# Patient Record
Sex: Female | Born: 1960 | Race: White | Hispanic: No | Smoking: Never smoker
Health system: Southern US, Community
[De-identification: ages and names within clinical notes are randomized; demographics above are authoritative.]

## PROBLEM LIST (undated history)

## (undated) DIAGNOSIS — I10 Essential (primary) hypertension: Secondary | ICD-10-CM

## (undated) DIAGNOSIS — E119 Type 2 diabetes mellitus without complications: Secondary | ICD-10-CM

## (undated) HISTORY — DX: Essential (primary) hypertension: I10

## (undated) HISTORY — PX: FRACTURE SURGERY: SHX138

## (undated) HISTORY — DX: Type 2 diabetes mellitus without complications: E11.9

---

## 1998-03-05 ENCOUNTER — Inpatient Hospital Stay (HOSPITAL_COMMUNITY): Admission: EM | Admit: 1998-03-05 | Discharge: 1998-03-06 | Payer: Self-pay | Admitting: Emergency Medicine

## 1998-03-05 ENCOUNTER — Encounter: Payer: Self-pay | Admitting: Emergency Medicine

## 1999-03-25 ENCOUNTER — Emergency Department (HOSPITAL_COMMUNITY): Admission: EM | Admit: 1999-03-25 | Discharge: 1999-03-25 | Payer: Self-pay | Admitting: *Deleted

## 2000-03-10 ENCOUNTER — Encounter: Payer: Self-pay | Admitting: Obstetrics and Gynecology

## 2000-03-10 ENCOUNTER — Ambulatory Visit (HOSPITAL_COMMUNITY): Admission: RE | Admit: 2000-03-10 | Discharge: 2000-03-10 | Payer: Self-pay | Admitting: Obstetrics and Gynecology

## 2000-03-29 ENCOUNTER — Other Ambulatory Visit: Admission: RE | Admit: 2000-03-29 | Discharge: 2000-03-29 | Payer: Self-pay | Admitting: Obstetrics and Gynecology

## 2000-05-17 ENCOUNTER — Encounter: Payer: Self-pay | Admitting: Obstetrics and Gynecology

## 2000-05-17 ENCOUNTER — Ambulatory Visit (HOSPITAL_COMMUNITY): Admission: RE | Admit: 2000-05-17 | Discharge: 2000-05-17 | Payer: Self-pay | Admitting: Obstetrics and Gynecology

## 2000-06-14 ENCOUNTER — Ambulatory Visit (HOSPITAL_COMMUNITY): Admission: RE | Admit: 2000-06-14 | Discharge: 2000-06-14 | Payer: Self-pay | Admitting: Obstetrics and Gynecology

## 2000-06-14 ENCOUNTER — Encounter: Payer: Self-pay | Admitting: Obstetrics and Gynecology

## 2000-06-17 ENCOUNTER — Ambulatory Visit (HOSPITAL_COMMUNITY): Admission: RE | Admit: 2000-06-17 | Discharge: 2000-06-17 | Payer: Self-pay | Admitting: Obstetrics and Gynecology

## 2000-07-12 ENCOUNTER — Inpatient Hospital Stay (HOSPITAL_COMMUNITY): Admission: AD | Admit: 2000-07-12 | Discharge: 2000-07-12 | Payer: Self-pay | Admitting: Obstetrics and Gynecology

## 2000-07-26 ENCOUNTER — Ambulatory Visit (HOSPITAL_COMMUNITY): Admission: RE | Admit: 2000-07-26 | Discharge: 2000-07-26 | Payer: Self-pay | Admitting: Obstetrics and Gynecology

## 2000-08-02 ENCOUNTER — Inpatient Hospital Stay (HOSPITAL_COMMUNITY): Admission: AD | Admit: 2000-08-02 | Discharge: 2000-08-02 | Payer: Self-pay | Admitting: Obstetrics and Gynecology

## 2000-08-02 ENCOUNTER — Encounter: Payer: Self-pay | Admitting: Obstetrics and Gynecology

## 2000-08-06 ENCOUNTER — Inpatient Hospital Stay (HOSPITAL_COMMUNITY): Admission: AD | Admit: 2000-08-06 | Discharge: 2000-08-11 | Payer: Self-pay | Admitting: Obstetrics and Gynecology

## 2002-05-07 ENCOUNTER — Emergency Department (HOSPITAL_COMMUNITY): Admission: EM | Admit: 2002-05-07 | Discharge: 2002-05-07 | Payer: Self-pay | Admitting: Emergency Medicine

## 2003-07-17 ENCOUNTER — Ambulatory Visit (HOSPITAL_COMMUNITY): Admission: RE | Admit: 2003-07-17 | Discharge: 2003-07-17 | Payer: Self-pay | Admitting: Emergency Medicine

## 2003-07-23 ENCOUNTER — Encounter: Admission: RE | Admit: 2003-07-23 | Discharge: 2003-07-23 | Payer: Self-pay | Admitting: Emergency Medicine

## 2004-08-27 ENCOUNTER — Ambulatory Visit: Payer: Self-pay | Admitting: Internal Medicine

## 2004-08-31 ENCOUNTER — Ambulatory Visit: Payer: Self-pay | Admitting: *Deleted

## 2004-09-15 ENCOUNTER — Ambulatory Visit: Payer: Self-pay | Admitting: Internal Medicine

## 2005-01-19 ENCOUNTER — Ambulatory Visit: Payer: Self-pay | Admitting: Internal Medicine

## 2005-04-12 ENCOUNTER — Ambulatory Visit: Payer: Self-pay | Admitting: Internal Medicine

## 2005-06-07 HISTORY — PX: BACK SURGERY: SHX140

## 2005-08-20 ENCOUNTER — Ambulatory Visit: Payer: Self-pay | Admitting: Internal Medicine

## 2005-08-31 ENCOUNTER — Ambulatory Visit: Payer: Self-pay | Admitting: Internal Medicine

## 2005-09-01 ENCOUNTER — Ambulatory Visit (HOSPITAL_COMMUNITY): Admission: RE | Admit: 2005-09-01 | Discharge: 2005-09-01 | Payer: Self-pay | Admitting: Internal Medicine

## 2005-09-03 ENCOUNTER — Ambulatory Visit: Payer: Self-pay | Admitting: Internal Medicine

## 2005-09-17 ENCOUNTER — Ambulatory Visit: Payer: Self-pay | Admitting: Internal Medicine

## 2005-10-06 ENCOUNTER — Ambulatory Visit (HOSPITAL_COMMUNITY): Admission: RE | Admit: 2005-10-06 | Discharge: 2005-10-07 | Payer: Self-pay | Admitting: Neurosurgery

## 2005-10-15 ENCOUNTER — Ambulatory Visit: Payer: Self-pay | Admitting: Internal Medicine

## 2010-10-20 ENCOUNTER — Ambulatory Visit: Payer: Self-pay

## 2010-10-26 ENCOUNTER — Ambulatory Visit: Payer: Self-pay | Attending: Family Medicine

## 2010-10-26 DIAGNOSIS — R5381 Other malaise: Secondary | ICD-10-CM | POA: Insufficient documentation

## 2010-10-26 DIAGNOSIS — M25519 Pain in unspecified shoulder: Secondary | ICD-10-CM | POA: Insufficient documentation

## 2010-10-26 DIAGNOSIS — IMO0001 Reserved for inherently not codable concepts without codable children: Secondary | ICD-10-CM | POA: Insufficient documentation

## 2010-10-26 DIAGNOSIS — M2560 Stiffness of unspecified joint, not elsewhere classified: Secondary | ICD-10-CM | POA: Insufficient documentation

## 2010-11-03 ENCOUNTER — Ambulatory Visit: Payer: Self-pay

## 2010-11-09 ENCOUNTER — Encounter: Payer: Self-pay | Admitting: Physical Therapy

## 2010-11-20 ENCOUNTER — Encounter: Payer: Self-pay | Admitting: Physical Therapy

## 2010-11-23 ENCOUNTER — Encounter: Payer: Self-pay | Admitting: Physical Therapy

## 2010-11-24 ENCOUNTER — Encounter: Payer: Self-pay | Admitting: Physical Therapy

## 2011-07-13 ENCOUNTER — Ambulatory Visit
Admission: RE | Admit: 2011-07-13 | Discharge: 2011-07-13 | Disposition: A | Discharge status: Home / Self Care | Payer: Managed Care, Other (non HMO) | Source: Ambulatory Visit | Attending: Family Medicine | Admitting: Family Medicine

## 2011-07-13 ENCOUNTER — Other Ambulatory Visit: Payer: Self-pay | Admitting: Family Medicine

## 2011-07-13 DIAGNOSIS — J209 Acute bronchitis, unspecified: Secondary | ICD-10-CM

## 2013-11-05 ENCOUNTER — Ambulatory Visit: Payer: Managed Care, Other (non HMO) | Admitting: Emergency Medicine

## 2013-11-05 VITALS — BP 168/98 | HR 83 | Temp 98.8°F | Resp 18 | Ht 66.0 in | Wt 315.4 lb

## 2013-11-05 DIAGNOSIS — E782 Mixed hyperlipidemia: Secondary | ICD-10-CM

## 2013-11-05 DIAGNOSIS — G4733 Obstructive sleep apnea (adult) (pediatric): Secondary | ICD-10-CM

## 2013-11-05 DIAGNOSIS — I10 Essential (primary) hypertension: Secondary | ICD-10-CM

## 2013-11-05 DIAGNOSIS — E119 Type 2 diabetes mellitus without complications: Secondary | ICD-10-CM

## 2013-11-05 MED ORDER — SITAGLIPTIN PHOS-METFORMIN HCL 50-1000 MG PO TABS: 1.0000 | ORAL_TABLET | ORAL | Status: DC

## 2013-11-05 MED ORDER — LISINOPRIL-HYDROCHLOROTHIAZIDE 20-25 MG PO TABS: 1.0000 | ORAL_TABLET | Freq: Every day | ORAL | Status: DC

## 2013-11-05 NOTE — Patient Instructions (Signed)

## 2013-11-05 NOTE — Progress Notes (Signed)
[facility] [address] [place] [address] [phone]  Date:  11/05/2013   Name:  Diane Lawson   DOB:  10/01/1960   MRN:  [id number]  PCP:  Emeterio Reeve, MD    Chief Complaint: Medication Refill   History of Present Illness:  Diane Lawson is a 53 y.o. very pleasant female patient who presents with the following:  NIDDM and hypertension.  Out of medications for a week.  Can't get in to see her regular doctor.  Tolerating her medications with no adverse effects.  Denies other complaint or health concern today.   There are no active problems to display for this patient.   Past Medical History  Diagnosis Date  . Diabetes mellitus without complication   . Hypertension     Past Surgical History  Procedure Laterality Date  . Cesarean section    . Fracture surgery      ankle (1999)  . Back surgery  2007    History  Substance Use Topics  . Smoking status: Never Smoker   . Smokeless tobacco: Not on file  . Alcohol Use: No    Family History  Problem Relation Age of Onset  . Adopted: Yes    No Known Allergies  Medication list has been reviewed and updated.  No current outpatient prescriptions on file prior to visit.   No current facility-administered medications on file prior to visit.    Review of Systems:  As per HPI, otherwise negative.    Physical Examination: Filed Vitals:   11/05/13 0954  BP: 168/98  Pulse: 83  Temp: 98.8 F (37.1 C)  Resp: 18   Filed Vitals:   11/05/13 0954  Height: 5\' 6"  (1.676 m)  Weight: 315 lb 6.4 oz (143.065 kg)   Body mass index is 50.93 kg/(m^2). Ideal Body Weight: Weight in (lb) to have BMI = 25: 154.6  GEN: morbid obesity, NAD, Non-toxic, A & O x 3 HEENT: Atraumatic, Normocephalic. Neck supple. No masses, No LAD. Ears and Nose: No external deformity. CV: RRR, No M/G/R. No JVD. No thrill. No extra heart sounds. PULM: CTA B, no wheezes, crackles, rhonchi. No retractions.  No resp. distress. No accessory muscle use. ABD: S, NT, ND, +BS. No rebound. No HSM. EXTR: No c/c/e NEURO Normal gait.  PSYCH: Normally interactive. Conversant. Not depressed or anxious appearing.  Calm demeanor.    Assessment and Plan: NIDDM Hypertension HLD Refills for one month Follow up with FMD   Signed,  Phillips Odor, MD

## 2014-06-20 ENCOUNTER — Other Ambulatory Visit (HOSPITAL_COMMUNITY)
Admission: RE | Admit: 2014-06-20 | Discharge: 2014-06-20 | Disposition: A | Discharge status: Home / Self Care | Payer: Managed Care, Other (non HMO) | Source: Ambulatory Visit | Attending: Family Medicine | Admitting: Family Medicine

## 2014-06-20 DIAGNOSIS — Z124 Encounter for screening for malignant neoplasm of cervix: Secondary | ICD-10-CM | POA: Diagnosis not present

## 2014-06-20 DIAGNOSIS — Z1151 Encounter for screening for human papillomavirus (HPV): Secondary | ICD-10-CM | POA: Insufficient documentation

## 2014-06-21 ENCOUNTER — Other Ambulatory Visit: Payer: Self-pay | Admitting: Family Medicine

## 2014-06-26 LAB — CYTOLOGY - PAP

## 2014-12-11 ENCOUNTER — Ambulatory Visit: Payer: Managed Care, Other (non HMO) | Admitting: Endocrinology

## 2020-11-06 ENCOUNTER — Emergency Department (HOSPITAL_COMMUNITY): Payer: Managed Care, Other (non HMO)

## 2020-11-06 ENCOUNTER — Encounter (HOSPITAL_COMMUNITY): Admission: EM | Disposition: A | Discharge status: Home / Self Care | Payer: Self-pay | Source: Home / Self Care

## 2020-11-06 ENCOUNTER — Inpatient Hospital Stay (HOSPITAL_COMMUNITY): Payer: Managed Care, Other (non HMO) | Admitting: Certified Registered Nurse Anesthetist

## 2020-11-06 ENCOUNTER — Encounter (HOSPITAL_COMMUNITY): Payer: Self-pay

## 2020-11-06 ENCOUNTER — Other Ambulatory Visit: Payer: Self-pay

## 2020-11-06 ENCOUNTER — Inpatient Hospital Stay (HOSPITAL_COMMUNITY)
Admission: EM | Admit: 2020-11-06 | Discharge: 2020-11-13 | DRG: 339 | Disposition: A | Discharge status: Home / Self Care | Payer: Managed Care, Other (non HMO) | Attending: General Surgery | Admitting: General Surgery

## 2020-11-06 DIAGNOSIS — Z6841 Body Mass Index (BMI) 40.0 and over, adult: Secondary | ICD-10-CM | POA: Diagnosis not present

## 2020-11-06 DIAGNOSIS — I10 Essential (primary) hypertension: Secondary | ICD-10-CM | POA: Diagnosis present

## 2020-11-06 DIAGNOSIS — E119 Type 2 diabetes mellitus without complications: Secondary | ICD-10-CM | POA: Diagnosis present

## 2020-11-06 DIAGNOSIS — R188 Other ascites: Secondary | ICD-10-CM

## 2020-11-06 DIAGNOSIS — K567 Ileus, unspecified: Secondary | ICD-10-CM | POA: Diagnosis not present

## 2020-11-06 DIAGNOSIS — Z794 Long term (current) use of insulin: Secondary | ICD-10-CM | POA: Diagnosis not present

## 2020-11-06 DIAGNOSIS — Z9049 Acquired absence of other specified parts of digestive tract: Secondary | ICD-10-CM

## 2020-11-06 DIAGNOSIS — Z880 Allergy status to penicillin: Secondary | ICD-10-CM | POA: Diagnosis not present

## 2020-11-06 DIAGNOSIS — Z79899 Other long term (current) drug therapy: Secondary | ICD-10-CM | POA: Diagnosis not present

## 2020-11-06 DIAGNOSIS — K3533 Acute appendicitis with perforation and localized peritonitis, with abscess: Principal | ICD-10-CM | POA: Diagnosis present

## 2020-11-06 DIAGNOSIS — K3532 Acute appendicitis with perforation and localized peritonitis, without abscess: Secondary | ICD-10-CM | POA: Diagnosis present

## 2020-11-06 DIAGNOSIS — K9189 Other postprocedural complications and disorders of digestive system: Secondary | ICD-10-CM

## 2020-11-06 DIAGNOSIS — Z20822 Contact with and (suspected) exposure to covid-19: Secondary | ICD-10-CM | POA: Diagnosis present

## 2020-11-06 HISTORY — PX: LAPAROSCOPIC APPENDECTOMY: SHX408

## 2020-11-06 LAB — COMPREHENSIVE METABOLIC PANEL
ALT: 23 U/L (ref 0–44)
AST: 19 U/L (ref 15–41)
Albumin: 3.3 g/dL — ABNORMAL LOW (ref 3.5–5.0)
Alkaline Phosphatase: 94 U/L (ref 38–126)
Anion gap: 13 (ref 5–15)
BUN: 17 mg/dL (ref 6–20)
CO2: 22 mmol/L (ref 22–32)
Calcium: 9.2 mg/dL (ref 8.9–10.3)
Chloride: 100 mmol/L (ref 98–111)
Creatinine, Ser: 1.12 mg/dL — ABNORMAL HIGH (ref 0.44–1.00)
GFR, Estimated: 57 mL/min — ABNORMAL LOW (ref >60)
Glucose, Bld: 298 mg/dL — ABNORMAL HIGH (ref 70–99)
Potassium: 4.1 mmol/L (ref 3.5–5.1)
Sodium: 135 mmol/L (ref 135–145)
Total Bilirubin: 2.1 mg/dL — ABNORMAL HIGH (ref 0.3–1.2)
Total Protein: 6.7 g/dL (ref 6.5–8.1)

## 2020-11-06 LAB — RESP PANEL BY RT-PCR (FLU A&B, COVID) ARPGX2
Influenza A by PCR: NEGATIVE
Influenza B by PCR: NEGATIVE
SARS Coronavirus 2 by RT PCR: NEGATIVE

## 2020-11-06 LAB — URINALYSIS, ROUTINE W REFLEX MICROSCOPIC
Bacteria, UA: NONE SEEN
Bilirubin Urine: NEGATIVE
Glucose, UA: >=500 mg/dL — AB
Hgb urine dipstick: NEGATIVE
Ketones, ur: 20 mg/dL — AB
Leukocytes,Ua: NEGATIVE
Nitrite: NEGATIVE
Protein, ur: 30 mg/dL — AB
Specific Gravity, Urine: 1.028 (ref 1.005–1.030)
pH: 5 (ref 5.0–8.0)

## 2020-11-06 LAB — CBC WITH DIFFERENTIAL/PLATELET
Abs Immature Granulocytes: 0.03 10*3/uL (ref 0.00–0.07)
Basophils Absolute: 0 10*3/uL (ref 0.0–0.1)
Basophils Relative: 0 %
Eosinophils Absolute: 0 10*3/uL (ref 0.0–0.5)
Eosinophils Relative: 0 %
HCT: 56.1 % — ABNORMAL HIGH (ref 36.0–46.0)
Hemoglobin: 18.4 g/dL — ABNORMAL HIGH (ref 12.0–15.0)
Immature Granulocytes: 1 %
Lymphocytes Relative: 6 %
Lymphs Abs: 0.4 10*3/uL — ABNORMAL LOW (ref 0.7–4.0)
MCH: 28.9 pg (ref 26.0–34.0)
MCHC: 32.8 g/dL (ref 30.0–36.0)
MCV: 88.2 fL (ref 80.0–100.0)
Monocytes Absolute: 0.3 10*3/uL (ref 0.1–1.0)
Monocytes Relative: 4 %
Neutro Abs: 5.7 10*3/uL (ref 1.7–7.7)
Neutrophils Relative %: 89 %
Platelets: 99 10*3/uL — ABNORMAL LOW (ref 150–400)
RBC: 6.36 MIL/uL — ABNORMAL HIGH (ref 3.87–5.11)
RDW: 15.2 % (ref 11.5–15.5)
WBC: 6.4 10*3/uL (ref 4.0–10.5)
nRBC: 0 % (ref 0.0–0.2)

## 2020-11-06 LAB — GLUCOSE, CAPILLARY
Glucose-Capillary: 353 mg/dL — ABNORMAL HIGH (ref 70–99)
Glucose-Capillary: 302 mg/dL — ABNORMAL HIGH (ref 70–99)
Glucose-Capillary: 268 mg/dL — ABNORMAL HIGH (ref 70–99)
Glucose-Capillary: 402 mg/dL — ABNORMAL HIGH (ref 70–99)

## 2020-11-06 LAB — LACTIC ACID, PLASMA
Lactic Acid, Venous: 2 mmol/L (ref 0.5–1.9)
Lactic Acid, Venous: 2.1 mmol/L (ref 0.5–1.9)

## 2020-11-06 LAB — LIPASE, BLOOD: Lipase: 24 U/L (ref 11–51)

## 2020-11-06 SURGERY — APPENDECTOMY, LAPAROSCOPIC
Anesthesia: General

## 2020-11-06 MED ORDER — TRAMADOL HCL 50 MG PO TABS
50.0000 mg | ORAL_TABLET | Freq: Four times a day (QID) | ORAL | Status: DC | PRN
Start: 2020-11-06 — End: 2020-11-12
  Administered 2020-11-06 – 2020-11-08 (×5): 50 mg via ORAL
  Filled 2020-11-06 (×5): qty 1

## 2020-11-06 MED ORDER — INSULIN ASPART 100 UNIT/ML IJ SOLN
INTRAMUSCULAR | Status: AC
  Filled 2020-11-06: qty 1

## 2020-11-06 MED ORDER — OXYCODONE HCL 5 MG PO TABS
5.0000 mg | ORAL_TABLET | ORAL | Status: DC | PRN
  Administered 2020-11-06 – 2020-11-07 (×2): 5 mg via ORAL
  Filled 2020-11-06 (×4): qty 1
  Filled 2020-11-06: qty 2

## 2020-11-06 MED ORDER — METRONIDAZOLE 500 MG/100ML IV SOLN
500.0000 mg | Freq: Once | INTRAVENOUS | Status: AC
  Administered 2020-11-06: 500 mg via INTRAVENOUS
  Filled 2020-11-06: qty 100

## 2020-11-06 MED ORDER — INSULIN ASPART 100 UNIT/ML IJ SOLN
0.0000 [IU] | Freq: Three times a day (TID) | INTRAMUSCULAR | Status: DC
  Administered 2020-11-06: 8 [IU] via SUBCUTANEOUS
  Administered 2020-11-06: 15 [IU] via SUBCUTANEOUS
  Administered 2020-11-07: 8 [IU] via SUBCUTANEOUS

## 2020-11-06 MED ORDER — SODIUM CHLORIDE 0.9 % IV SOLN
2.0000 g | Freq: Once | INTRAVENOUS | Status: AC
  Administered 2020-11-06: 2 g via INTRAVENOUS
  Filled 2020-11-06: qty 2

## 2020-11-06 MED ORDER — ENOXAPARIN SODIUM 40 MG/0.4ML IJ SOSY: 40.0000 mg | PREFILLED_SYRINGE | INTRAMUSCULAR | Status: DC

## 2020-11-06 MED ORDER — PROPOFOL 10 MG/ML IV BOLUS
INTRAVENOUS | Status: DC | PRN
  Administered 2020-11-06: 200 mg via INTRAVENOUS

## 2020-11-06 MED ORDER — ROCURONIUM BROMIDE 10 MG/ML (PF) SYRINGE
PREFILLED_SYRINGE | INTRAVENOUS | Status: DC | PRN
  Administered 2020-11-06: 30 mg via INTRAVENOUS
  Administered 2020-11-06: 20 mg via INTRAVENOUS
  Administered 2020-11-06: 70 mg via INTRAVENOUS
  Administered 2020-11-06: 10 mg via INTRAVENOUS

## 2020-11-06 MED ORDER — ACETAMINOPHEN 10 MG/ML IV SOLN
INTRAVENOUS | Status: AC
  Filled 2020-11-06: qty 100

## 2020-11-06 MED ORDER — OXYCODONE HCL 5 MG PO TABS: 5.0000 mg | ORAL_TABLET | ORAL | Status: DC | PRN

## 2020-11-06 MED ORDER — FENTANYL CITRATE (PF) 250 MCG/5ML IJ SOLN
INTRAMUSCULAR | Status: AC
  Filled 2020-11-06: qty 5

## 2020-11-06 MED ORDER — LIDOCAINE 2% (20 MG/ML) 5 ML SYRINGE
INTRAMUSCULAR | Status: DC | PRN
  Administered 2020-11-06: 100 mg via INTRAVENOUS

## 2020-11-06 MED ORDER — SODIUM CHLORIDE 0.45 % IV SOLN
INTRAVENOUS | Status: DC
  Administered 2020-11-07: 999 mL via INTRAVENOUS

## 2020-11-06 MED ORDER — MELATONIN 3 MG PO TABS
3.0000 mg | ORAL_TABLET | Freq: Every evening | ORAL | Status: DC | PRN
  Administered 2020-11-06 – 2020-11-12 (×4): 3 mg via ORAL
  Filled 2020-11-06 (×5): qty 1

## 2020-11-06 MED ORDER — BUPIVACAINE-EPINEPHRINE 0.25% -1:200000 IJ SOLN
INTRAMUSCULAR | Status: DC | PRN
  Administered 2020-11-06: 20 mL

## 2020-11-06 MED ORDER — FENTANYL CITRATE (PF) 250 MCG/5ML IJ SOLN
INTRAMUSCULAR | Status: DC | PRN
  Administered 2020-11-06 (×2): 50 ug via INTRAVENOUS

## 2020-11-06 MED ORDER — BUPROPION HCL ER (XL) 150 MG PO TB24
300.0000 mg | ORAL_TABLET | Freq: Every day | ORAL | Status: DC
  Administered 2020-11-07 – 2020-11-13 (×6): 300 mg via ORAL
  Filled 2020-11-06 (×3): qty 2
  Filled 2020-11-06: qty 1
  Filled 2020-11-06 (×3): qty 2

## 2020-11-06 MED ORDER — AMISULPRIDE (ANTIEMETIC) 5 MG/2ML IV SOLN: 10.0000 mg | Freq: Once | INTRAVENOUS | Status: DC | PRN

## 2020-11-06 MED ORDER — MIDAZOLAM HCL 2 MG/2ML IJ SOLN
INTRAMUSCULAR | Status: DC | PRN
  Administered 2020-11-06: 2 mg via INTRAVENOUS

## 2020-11-06 MED ORDER — PHENYLEPHRINE 40 MCG/ML (10ML) SYRINGE FOR IV PUSH (FOR BLOOD PRESSURE SUPPORT)
PREFILLED_SYRINGE | INTRAVENOUS | Status: DC | PRN
  Administered 2020-11-06: 120 ug via INTRAVENOUS

## 2020-11-06 MED ORDER — LACTATED RINGERS IV SOLN: INTRAVENOUS | Status: DC

## 2020-11-06 MED ORDER — ACETAMINOPHEN 500 MG PO TABS
1000.0000 mg | ORAL_TABLET | Freq: Four times a day (QID) | ORAL | Status: DC
  Administered 2020-11-06 – 2020-11-12 (×19): 1000 mg via ORAL
  Filled 2020-11-06 (×20): qty 2

## 2020-11-06 MED ORDER — SIMETHICONE 80 MG PO CHEW: 40.0000 mg | CHEWABLE_TABLET | Freq: Four times a day (QID) | ORAL | Status: DC | PRN

## 2020-11-06 MED ORDER — METHOCARBAMOL 500 MG PO TABS
500.0000 mg | ORAL_TABLET | Freq: Four times a day (QID) | ORAL | Status: DC | PRN
  Administered 2020-11-06 – 2020-11-09 (×2): 500 mg via ORAL
  Filled 2020-11-06 (×3): qty 1

## 2020-11-06 MED ORDER — HYDROCHLOROTHIAZIDE 25 MG PO TABS
12.5000 mg | ORAL_TABLET | Freq: Every morning | ORAL | Status: DC
  Administered 2020-11-07 – 2020-11-13 (×6): 12.5 mg via ORAL
  Filled 2020-11-06 (×6): qty 1

## 2020-11-06 MED ORDER — DEXAMETHASONE SODIUM PHOSPHATE 10 MG/ML IJ SOLN
INTRAMUSCULAR | Status: DC | PRN
  Administered 2020-11-06: 4 mg via INTRAVENOUS

## 2020-11-06 MED ORDER — SODIUM CHLORIDE 0.9 % IV SOLN: INTRAVENOUS | Status: DC

## 2020-11-06 MED ORDER — FENTANYL CITRATE (PF) 100 MCG/2ML IJ SOLN: 25.0000 ug | INTRAMUSCULAR | Status: DC | PRN

## 2020-11-06 MED ORDER — MIDAZOLAM HCL 2 MG/2ML IJ SOLN
INTRAMUSCULAR | Status: AC
  Filled 2020-11-06: qty 2

## 2020-11-06 MED ORDER — SODIUM CHLORIDE 0.9 % IV BOLUS
1000.0000 mL | Freq: Once | INTRAVENOUS | Status: AC
  Administered 2020-11-06: 1000 mL via INTRAVENOUS

## 2020-11-06 MED ORDER — ONDANSETRON 4 MG PO TBDP: 4.0000 mg | ORAL_TABLET | Freq: Four times a day (QID) | ORAL | Status: DC | PRN

## 2020-11-06 MED ORDER — FENTANYL CITRATE (PF) 100 MCG/2ML IJ SOLN
50.0000 ug | Freq: Once | INTRAMUSCULAR | Status: AC
  Administered 2020-11-06: 50 ug via INTRAVENOUS
  Filled 2020-11-06: qty 2

## 2020-11-06 MED ORDER — HYDRALAZINE HCL 20 MG/ML IJ SOLN: 10.0000 mg | INTRAMUSCULAR | Status: DC | PRN

## 2020-11-06 MED ORDER — PROPOFOL 10 MG/ML IV BOLUS
INTRAVENOUS | Status: AC
  Filled 2020-11-06: qty 40

## 2020-11-06 MED ORDER — DEXAMETHASONE SODIUM PHOSPHATE 10 MG/ML IJ SOLN
INTRAMUSCULAR | Status: AC
  Filled 2020-11-06: qty 1

## 2020-11-06 MED ORDER — CHLORHEXIDINE GLUCONATE 0.12 % MT SOLN
OROMUCOSAL | Status: AC
  Administered 2020-11-06: 15 mL via OROMUCOSAL
  Filled 2020-11-06: qty 15

## 2020-11-06 MED ORDER — METRONIDAZOLE 500 MG/100ML IV SOLN
500.0000 mg | Freq: Once | INTRAVENOUS | Status: DC
  Filled 2020-11-06 (×2): qty 100

## 2020-11-06 MED ORDER — OXYCODONE HCL 5 MG PO TABS: 5.0000 mg | ORAL_TABLET | Freq: Once | ORAL | Status: DC | PRN

## 2020-11-06 MED ORDER — SUCCINYLCHOLINE CHLORIDE 200 MG/10ML IV SOSY
PREFILLED_SYRINGE | INTRAVENOUS | Status: AC
  Filled 2020-11-06: qty 10

## 2020-11-06 MED ORDER — ONDANSETRON HCL 4 MG/2ML IJ SOLN
4.0000 mg | Freq: Once | INTRAMUSCULAR | Status: AC
  Administered 2020-11-06: 4 mg via INTRAVENOUS
  Filled 2020-11-06: qty 2

## 2020-11-06 MED ORDER — ONDANSETRON HCL 4 MG/2ML IJ SOLN
4.0000 mg | Freq: Four times a day (QID) | INTRAMUSCULAR | Status: DC | PRN
  Administered 2020-11-08 – 2020-11-11 (×8): 4 mg via INTRAVENOUS
  Filled 2020-11-06 (×8): qty 2

## 2020-11-06 MED ORDER — CHLORHEXIDINE GLUCONATE 0.12 % MT SOLN
15.0000 mL | OROMUCOSAL | Status: AC
  Filled 2020-11-06: qty 15

## 2020-11-06 MED ORDER — ONDANSETRON HCL 4 MG/2ML IJ SOLN
INTRAMUSCULAR | Status: AC
  Filled 2020-11-06: qty 2

## 2020-11-06 MED ORDER — SODIUM CHLORIDE 0.9 % IV SOLN
2.0000 g | Freq: Three times a day (TID) | INTRAVENOUS | Status: DC
  Administered 2020-11-06 – 2020-11-13 (×21): 2 g via INTRAVENOUS
  Filled 2020-11-06 (×21): qty 2

## 2020-11-06 MED ORDER — ONDANSETRON HCL 4 MG/2ML IJ SOLN
INTRAMUSCULAR | Status: DC | PRN
  Administered 2020-11-06: 4 mg via INTRAVENOUS

## 2020-11-06 MED ORDER — SUCCINYLCHOLINE CHLORIDE 200 MG/10ML IV SOSY
PREFILLED_SYRINGE | INTRAVENOUS | Status: DC | PRN
  Administered 2020-11-06: 160 mg via INTRAVENOUS

## 2020-11-06 MED ORDER — DIPHENHYDRAMINE HCL 25 MG PO CAPS: 25.0000 mg | ORAL_CAPSULE | Freq: Four times a day (QID) | ORAL | Status: DC | PRN

## 2020-11-06 MED ORDER — SUGAMMADEX SODIUM 500 MG/5ML IV SOLN
INTRAVENOUS | Status: DC | PRN
  Administered 2020-11-06: 400 mg via INTRAVENOUS

## 2020-11-06 MED ORDER — ONDANSETRON HCL 4 MG/2ML IJ SOLN: 4.0000 mg | Freq: Four times a day (QID) | INTRAMUSCULAR | Status: DC | PRN

## 2020-11-06 MED ORDER — ACETAMINOPHEN 650 MG RE SUPP: 650.0000 mg | Freq: Four times a day (QID) | RECTAL | Status: DC | PRN

## 2020-11-06 MED ORDER — ONDANSETRON HCL 4 MG/2ML IJ SOLN: 4.0000 mg | Freq: Once | INTRAMUSCULAR | Status: DC | PRN

## 2020-11-06 MED ORDER — SUGAMMADEX SODIUM 500 MG/5ML IV SOLN
INTRAVENOUS | Status: AC
  Filled 2020-11-06: qty 5

## 2020-11-06 MED ORDER — METRONIDAZOLE 500 MG/100ML IV SOLN
500.0000 mg | Freq: Three times a day (TID) | INTRAVENOUS | Status: DC
  Administered 2020-11-06 – 2020-11-13 (×21): 500 mg via INTRAVENOUS
  Filled 2020-11-06 (×20): qty 100

## 2020-11-06 MED ORDER — INSULIN GLARGINE 100 UNIT/ML ~~LOC~~ SOLN
30.0000 [IU] | Freq: Every day | SUBCUTANEOUS | Status: DC
  Administered 2020-11-06 – 2020-11-07 (×2): 30 [IU] via SUBCUTANEOUS
  Filled 2020-11-06 (×3): qty 0.3

## 2020-11-06 MED ORDER — MORPHINE SULFATE (PF) 2 MG/ML IV SOLN: 1.0000 mg | INTRAVENOUS | Status: DC | PRN

## 2020-11-06 MED ORDER — DIPHENHYDRAMINE HCL 50 MG/ML IJ SOLN: 25.0000 mg | Freq: Four times a day (QID) | INTRAMUSCULAR | Status: DC | PRN

## 2020-11-06 MED ORDER — ENOXAPARIN SODIUM 40 MG/0.4ML IJ SOSY
40.0000 mg | PREFILLED_SYRINGE | INTRAMUSCULAR | Status: DC
  Administered 2020-11-07 – 2020-11-11 (×5): 40 mg via SUBCUTANEOUS
  Filled 2020-11-06 (×5): qty 0.4

## 2020-11-06 MED ORDER — OXYCODONE HCL 5 MG/5ML PO SOLN: 5.0000 mg | Freq: Once | ORAL | Status: DC | PRN

## 2020-11-06 MED ORDER — ACETAMINOPHEN 10 MG/ML IV SOLN
INTRAVENOUS | Status: DC | PRN
  Administered 2020-11-06: 1000 mg via INTRAVENOUS

## 2020-11-06 MED ORDER — HYDROMORPHONE HCL 1 MG/ML IJ SOLN: 1.0000 mg | INTRAMUSCULAR | Status: DC | PRN

## 2020-11-06 MED ORDER — SODIUM CHLORIDE 0.9 % IR SOLN
Status: DC | PRN
  Administered 2020-11-06: 1000 mL

## 2020-11-06 MED ORDER — ROCURONIUM BROMIDE 10 MG/ML (PF) SYRINGE
PREFILLED_SYRINGE | INTRAVENOUS | Status: AC
  Filled 2020-11-06: qty 10

## 2020-11-06 MED ORDER — LIDOCAINE 2% (20 MG/ML) 5 ML SYRINGE
INTRAMUSCULAR | Status: AC
  Filled 2020-11-06: qty 5

## 2020-11-06 MED ORDER — ACETAMINOPHEN 325 MG PO TABS: 650.0000 mg | ORAL_TABLET | Freq: Four times a day (QID) | ORAL | Status: DC | PRN

## 2020-11-06 SURGICAL SUPPLY — 42 items
APPLIER CLIP ROT 10 11.4 M/L (STAPLE)
BIOPATCH RED 1 DISK 7.0 (GAUZE/BANDAGES/DRESSINGS) ×2 IMPLANT
BIOPATCH RED 1IN DISK 7.0MM (GAUZE/BANDAGES/DRESSINGS) ×1
CANISTER SUCT 3000ML PPV (MISCELLANEOUS) ×3 IMPLANT
CHLORAPREP W/TINT 26 (MISCELLANEOUS) ×6 IMPLANT
CLIP APPLIE ROT 10 11.4 M/L (STAPLE) IMPLANT
COVER SURGICAL LIGHT HANDLE (MISCELLANEOUS) ×6 IMPLANT
CUTTER FLEX LINEAR 45M (STAPLE) ×3 IMPLANT
DERMABOND ADHESIVE PROPEN (GAUZE/BANDAGES/DRESSINGS) ×2
DERMABOND ADVANCED .7 DNX6 (GAUZE/BANDAGES/DRESSINGS) ×1 IMPLANT
DRAIN CHANNEL 19F RND (DRAIN) ×3 IMPLANT
ELECT REM PT RETURN 15FT ADLT (MISCELLANEOUS) ×3 IMPLANT
ELECT REM PT RETURN 9FT ADLT (ELECTROSURGICAL) ×3
ELECTRODE REM PT RTRN 9FT ADLT (ELECTROSURGICAL) ×1 IMPLANT
EVACUATOR SILICONE 100CC (DRAIN) ×3 IMPLANT
GAUZE SPONGE 2X2 8PLY STRL LF (GAUZE/BANDAGES/DRESSINGS) ×1 IMPLANT
GLOVE SURG ORTHO 8.0 STRL STRW (GLOVE) ×3 IMPLANT
GOWN STRL REUS W/ TWL LRG LVL3 (GOWN DISPOSABLE) ×2 IMPLANT
GOWN STRL REUS W/ TWL XL LVL3 (GOWN DISPOSABLE) ×1 IMPLANT
GOWN STRL REUS W/TWL LRG LVL3 (GOWN DISPOSABLE) ×6
GOWN STRL REUS W/TWL XL LVL3 (GOWN DISPOSABLE) ×3
KIT BASIN OR (CUSTOM PROCEDURE TRAY) ×3 IMPLANT
KIT TURNOVER KIT B (KITS) ×3 IMPLANT
PAD ARMBOARD 7.5X6 YLW CONV (MISCELLANEOUS) ×6 IMPLANT
POUCH SPECIMEN RETRIEVAL 10MM (ENDOMECHANICALS) ×3 IMPLANT
RELOAD 45 THICK GREEN (ENDOMECHANICALS) ×3 IMPLANT
RELOAD STAPLE TA45 3.5 REG BLU (ENDOMECHANICALS) ×3 IMPLANT
SET IRRIG TUBING LAPAROSCOPIC (IRRIGATION / IRRIGATOR) ×3 IMPLANT
SET TUBE SMOKE EVAC HIGH FLOW (TUBING) ×3 IMPLANT
SHEARS HARMONIC ACE PLUS 36CM (ENDOMECHANICALS) ×3 IMPLANT
SPECIMEN JAR SMALL (MISCELLANEOUS) ×3 IMPLANT
SPONGE GAUZE 2X2 STER 10/PKG (GAUZE/BANDAGES/DRESSINGS) ×2
SUT ETHILON 2 0 FS 18 (SUTURE) ×3 IMPLANT
SUT MNCRL AB 4-0 PS2 18 (SUTURE) ×3 IMPLANT
SUT VIC AB 3-0 SH 27 (SUTURE) ×3
SUT VIC AB 3-0 SH 27X BRD (SUTURE) ×1 IMPLANT
TOWEL GREEN STERILE FF (TOWEL DISPOSABLE) ×3 IMPLANT
TRAY FOLEY W/BAG SLVR 14FR (SET/KITS/TRAYS/PACK) ×3 IMPLANT
TRAY LAPAROSCOPIC MC (CUSTOM PROCEDURE TRAY) ×3 IMPLANT
TROCAR XCEL BLUNT TIP 100MML (ENDOMECHANICALS) ×3 IMPLANT
TROCAR XCEL NON-BLD 11X100MML (ENDOMECHANICALS) ×3 IMPLANT
TROCAR XCEL NON-BLD 5MMX100MML (ENDOMECHANICALS) ×3 IMPLANT

## 2020-11-06 NOTE — H&P (Signed)
Diane Lawson Jun 26, 1960  [id number].    Chief Complaint/Reason for Consult: acute perforated appendicitis  HPI:  This is a 60 yo very pleasant, morbidly obese white female with a history of HTN and DM, generally well controlled at home who began having central abdominal pain on Tuesday.  This began to progress and migrated to the RLQ.  She took some ibuprofen which didn't really make much difference.  She developed some N/V with her last emesis earlier this morning.  She felt hot at home and has had a temp here of 101.4.  She denies diarrhea and has been more constipated, but had a small BM yesterday.  Her pain got significantly worse overnight and she called EMS.  Upon arrival, she has been worked up and found to have acute perforated appendicitis with fever, tachycardia.  Her WBCs are normal at 6.4, but plts at 99K.  Blood glucose is 298, but normally runs in the 160s at home on her home regimen.  We have been asked to see her for admission.  ROS: ROS; Please see HPI, otherwise all other systems have been reviewed and are negative.  Family History  Adopted: Yes    Past Medical History:  Diagnosis Date  . Diabetes mellitus without complication (HCC)   . Hypertension     Past Surgical History:  Procedure Laterality Date  . BACK SURGERY  2007  . CESAREAN SECTION    . FRACTURE SURGERY     ankle (1999)    Social History:  reports that she has never smoked. She does not have any smokeless tobacco history on file. She reports that she does not drink alcohol and does not use drugs.  Allergies:  Allergies  Allergen Reactions  . Penicillins Other (See Comments)    Childhood     (Not in a hospital admission)    Physical Exam: Blood pressure (!) 153/101, pulse (!) 107, temperature (!) 101.4 F (38.6 C), temperature source Oral, resp. rate (!) 24, height 5\' 7"  (1.702 m), weight (!) 145.2 kg, SpO2 100 %. General: pleasant, obese white female who is laying in bed in  NAD HEENT: head is normocephalic, atraumatic.  Sclera are noninjected.  PERRL.  Ears and nose without any masses or lesions.  Mouth is pink and moist Heart: regular, rate, and rhythm, but mildly tachy in low 100s.  Normal s1,s2. No obvious murmurs, gallops, or rubs noted.  Palpable radial and pedal pulses bilaterally Lungs: CTAB, no wheezes, rhonchi, or rales noted.  Respiratory effort nonlabored.  O2 in place for comfort Abd: soft, focally tender in RLQ, ND/obese, +BS, no masses.  Small reducible umbilical hernia noted.  Unable to palpable organomegaly secondary to body habitus. MS: all 4 extremities are symmetrical with no cyanosis, clubbing, or edema. Skin: warm and dry with no masses, lesions, or rashes Neuro: Cranial nerves 2-12 grossly intact, sensation is normal throughout Psych: A&Ox3 with an appropriate affect.   Results for orders placed or performed during the hospital encounter of 11/06/20 (from the past 48 hour(s))  Comprehensive metabolic panel     Status: Abnormal   Collection Time: 11/06/20  7:19 AM  Result Value Ref Range   Sodium 135 135 - 145 mmol/L   Potassium 4.1 3.5 - 5.1 mmol/L   Chloride 100 98 - 111 mmol/L   CO2 22 22 - 32 mmol/L   Glucose, Bld 298 (H) 70 - 99 mg/dL    Comment: Glucose reference range applies only to samples taken after fasting  for at least 8 hours.   BUN 17 6 - 20 mg/dL   Creatinine, Ser 1.61 (H) 0.44 - 1.00 mg/dL   Calcium 9.2 8.9 - 09.6 mg/dL   Total Protein 6.7 6.5 - 8.1 g/dL   Albumin 3.3 (L) 3.5 - 5.0 g/dL   AST 19 15 - 41 U/L   ALT 23 0 - 44 U/L   Alkaline Phosphatase 94 38 - 126 U/L   Total Bilirubin 2.1 (H) 0.3 - 1.2 mg/dL   GFR, Estimated 57 (L) >60 mL/min    Comment: (NOTE) Calculated using the CKD-EPI Creatinine Equation (2021)    Anion gap 13 5 - 15    Comment: Performed at [facility], [address], [place], [place] [address]  Lipase, blood     Status: None   Collection Time: 11/06/20  7:19 AM  Result Value Ref  Range   Lipase 24 11 - 51 U/L    Comment: Performed at [facility], [address] [place], [place] [address]  Lactic acid, plasma     Status: Abnormal   Collection Time: 11/06/20  7:19 AM  Result Value Ref Range   Lactic Acid, Venous 2.0 (HH) 0.5 - 1.9 mmol/L    Comment: CRITICAL RESULT CALLED TO, READ BACK BY AND VERIFIED WITH: D.JOHNSON RN [phone] 11/06/20 MCCORMICK K Performed at [facility], [address] [place], [place] [address]   CBC with Differential/Platelet     Status: Abnormal   Collection Time: 11/06/20  8:06 AM  Result Value Ref Range   WBC 6.4 4.0 - 10.5 K/uL   RBC 6.36 (H) 3.87 - 5.11 MIL/uL   Hemoglobin 18.4 (H) 12.0 - 15.0 g/dL    Comment: REPEATED TO VERIFY   HCT 56.1 (H) 36.0 - 46.0 %   MCV 88.2 80.0 - 100.0 fL   MCH 28.9 26.0 - 34.0 pg   MCHC 32.8 30.0 - 36.0 g/dL   RDW 13.0 86.5 - 78.4 %   Platelets 99 (L) 150 - 400 K/uL    Comment: Immature Platelet Fraction may be clinically indicated, consider ordering this additional test [id number] REPEATED TO VERIFY PLATELET COUNT CONFIRMED BY SMEAR    nRBC 0.0 0.0 - 0.2 %   Neutrophils Relative % 89 %   Neutro Abs 5.7 1.7 - 7.7 K/uL   Lymphocytes Relative 6 %   Lymphs Abs 0.4 (L) 0.7 - 4.0 K/uL   Monocytes Relative 4 %   Monocytes Absolute 0.3 0.1 - 1.0 K/uL   Eosinophils Relative 0 %   Eosinophils Absolute 0.0 0.0 - 0.5 K/uL   Basophils Relative 0 %   Basophils Absolute 0.0 0.0 - 0.1 K/uL   Immature Granulocytes 1 %   Abs Immature Granulocytes 0.03 0.00 - 0.07 K/uL    Comment: Performed at [facility], [address] [place], [place] [address]  Urinalysis, Routine w reflex microscopic Urine, Clean Catch     Status: Abnormal   Collection Time: 11/06/20  9:22 AM  Result Value Ref Range   Color, Urine YELLOW YELLOW   APPearance CLEAR CLEAR   Specific Gravity, Urine 1.028 1.005 - 1.030   pH 5.0 5.0 - 8.0   Glucose, UA >=500 (A) NEGATIVE mg/dL   Hgb urine dipstick NEGATIVE NEGATIVE    Bilirubin Urine NEGATIVE NEGATIVE   Ketones, ur 20 (A) NEGATIVE mg/dL   Protein, ur 30 (A) NEGATIVE mg/dL   Nitrite NEGATIVE NEGATIVE   Leukocytes,Ua NEGATIVE NEGATIVE  RBC / HPF 0-5 0 - 5 RBC/hpf   WBC, UA 0-5 0 - 5 WBC/hpf   Bacteria, UA NONE SEEN NONE SEEN   Squamous Epithelial / LPF 0-5 0 - 5    Comment: Performed at [facility], [address], [place], [place] [address]   CT Abdomen Pelvis Wo Contrast  Result Date: 11/06/2020 CLINICAL DATA:  Epigastric pain radiating to the right lower quadrant with nausea and vomiting. Fever. Constipation. EXAM: CT ABDOMEN AND PELVIS WITHOUT CONTRAST TECHNIQUE: Multidetector CT imaging of the abdomen and pelvis was performed following the standard protocol without IV contrast. COMPARISON:  None. FINDINGS: Lower chest: Clear lung bases.  Normal heart size. Hepatobiliary: No focal liver abnormality is seen. No gallstones, gallbladder wall thickening, or biliary dilatation. Pancreas: Unremarkable. Spleen: Unremarkable. Adrenals/Urinary Tract: Unremarkable adrenal glands. Mildly lobular contour and mild cortical thinning involving both kidneys. No renal calculi or hydronephrosis. Unremarkable bladder. Stomach/Bowel: The stomach is unremarkable. There is no evidence of bowel obstruction. The appendix is dilated and thick walled measuring 1.5 cm in diameter near its base with an associated 5 mm appendicolith. There is moderate inflammation in the surrounding fat, and there are multiple small foci of extraluminal gas in this region. Vascular/Lymphatic: Abdominal aortic atherosclerosis without aneurysm. No enlarged lymph nodes. Reproductive: Uterus and bilateral adnexa are unremarkable. Other: Small volume intraperitoneal free fluid in the right lower quadrant. No drainable fluid collection. Musculoskeletal: No acute osseous abnormality or suspicious osseous lesion. Lumbar disc degeneration greatest at L4-5 with a disc osteophyte complex resulting in asymmetric  left lateral recess stenosis. 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 IMPRESSION: 1. Perforated acute appendicitis.  No drainable fluid collection. 2. Aortic Atherosclerosis (ICD10-I70.0). These results were called by telephone at the time of interpretation on 11/06/2020 at 8:34 am to Dr. Rolan Bucco, who verbally acknowledged these results. Electronically Signed   By: Sebastian Ache M.D.   On: 11/06/2020 08:36      Assessment/Plan HTN - resume home meds DM - SSI, when taking in diet will transition back to home meds  Acute perforated appendicitis The patient's chart and imaging have been reviewed.  She appears to have perforated appendicitis with no abscess.  She is mildly tachy with a fever and pain the RLQ.  We will admit her and plan to go to the OR for lap appy, possible open, possible partial bowel resection.  We discussed the need to likely be admitted to the hospital for at least a couple of days post op given the perforation, but could be here for longer pending the clinical course.  she understands.  I have discussed the procedure and risks of appendectomy. The risks include but are not limited to bleeding, infection, wound problems, anesthesia, injury to intra-abdominal organs, possibility of postoperative ileus. She seems to understand and agrees with the plan.  FEN - NPO/IVFs/SSI VTE - lovenox to start tomorrow ID - cefipime/flagyl due to PCN allergy Admit - inpatient  Letha Cape, [facility] 11/06/2020, 9:42 AM Please see Amion for pager number during day hours 7:00am-4:30pm or 7:00am -11:30am on weekends

## 2020-11-06 NOTE — Interval H&P Note (Signed)
History and Physical Interval Note:  11/06/2020 10:30 AM  Diane Lawson  has presented today for surgery, with the diagnosis of Perforated Appendix.  The various methods of treatment have been discussed with the patient and family. After consideration of risks, benefits and other options for treatment, the patient has consented to    Procedure(s): APPENDECTOMY LAPAROSCOPIC (N/A) as a surgical intervention.    The patient's history has been reviewed, patient examined, no change in status, stable for surgery.  I have reviewed the patient's chart and labs.  Questions were answered to the patient's satisfaction.    Darnell Level, MD [facility] Office: [phone]   Darnell Level

## 2020-11-06 NOTE — Anesthesia Preprocedure Evaluation (Signed)
Anesthesia Evaluation  Patient identified by MRN, date of birth, ID band Patient awake    Reviewed: Allergy & Precautions, NPO status , Patient's Chart, lab work & pertinent test results  History of Anesthesia Complications Negative for: history of anesthetic complications  Airway Mallampati: III  TM Distance: >3 FB Neck ROM: Full    Dental  (+) Dental Advisory Given, Teeth Intact   Pulmonary neg pulmonary ROS,    Pulmonary exam normal        Cardiovascular hypertension, Normal cardiovascular exam     Neuro/Psych negative neurological ROS     GI/Hepatic Neg liver ROS, Acute perforated appendicitis   Endo/Other  diabetes, Insulin Dependent, Oral Hypoglycemic AgentsMorbid obesity  Renal/GU negative Renal ROS  negative genitourinary   Musculoskeletal negative musculoskeletal ROS (+)   Abdominal   Peds  Hematology negative hematology ROS (+)   Anesthesia Other Findings   Reproductive/Obstetrics                           Anesthesia Physical Anesthesia Plan  ASA: III  Anesthesia Plan: General   Post-op Pain Management:    Induction: Intravenous, Rapid sequence and Cricoid pressure planned  PONV Risk Score and Plan: 4 or greater and Ondansetron, Dexamethasone, Treatment may vary due to age or medical condition and Midazolam  Airway Management Planned: Oral ETT  Additional Equipment: None  Intra-op Plan:   Post-operative Plan: Extubation in OR  Informed Consent: I have reviewed the patients History and Physical, chart, labs and discussed the procedure including the risks, benefits and alternatives for the proposed anesthesia with the patient or authorized representative who has indicated his/her understanding and acceptance.     Dental advisory given  Plan Discussed with:   Anesthesia Plan Comments:         Anesthesia Quick Evaluation

## 2020-11-06 NOTE — Op Note (Signed)
OPERATIVE REPORT - LAPAROSCOPIC APPENDECTOMY  Preop diagnosis:  Acute appendicitis with perforation  Postop diagnosis:  same  Procedure:  Laparoscopic appendectomy with drain placement  Surgeon:  Darnell Level, MD  Anesthesia:  general endotracheal  Estimated blood loss:  minimal  Preparation:  Chlora-prep  Complications:  none  Indications: Patient is a 60 year old female with onset of right lower quadrant abdominal pain.  She developed fever.  She developed nausea and emesis.  She presented to the emergency department for evaluation.  CT scan of the abdomen and pelvis demonstrated findings consistent with acute perforated appendicitis.  Patient now comes to the operating room for appendectomy.  Procedure:  Patient is brought to the operating room and placed in a supine position on the operating room table. Following administration of general anesthesia, a time out was held and the patient's name and procedure is confirmed. Patient is then prepped and draped in the usual strict aseptic fashion.  After ascertaining that an adequate level of anesthesia has been achieved, a peri-umbilical incision is made with a #15 blade. Dissection is carried down to the fascia. Fascia is incised in the midline and the peritoneal cavity is entered cautiously. A #0-vicryl pursestring suture is placed in the fascia. An Hassan cannula is introduced under direct vision and secured with the pursestring suture. The abdomen is insufflated with carbon dioxide. The laparoscope is introduced and the abdomen is explored. Operative ports are placed in the right upper quadrant and left lower quadrant. The appendix is identified.  The appendix appears to be perforated approximately 2 cm from the base of the appendix at the cecal wall.  A window is created behind the appendix which allows for placement of a Endo GIA stapler across the base of the appendix.  Using the thicker 4.1 mm staple load, the base of the appendix is  transected with a stapler.  The appendix is then mobilized off of the cecal wall and the mesoappendix divided with the harmonic scalpel.  With some difficulty the appendix was followed to its tip in the surrounding adipose tissue excised with the appendix.  The appendix is placed into an endo-catch bag and withdrawn through the umbilical port.  The umbilical port is replaced.  A 19 [place] Blake drain is inserted through the umbilical port into the peritoneal cavity and then brought out through the right subcostal port.  The drain is positioned around the base of the cecum and the drain is secured to the skin with a 2-0 nylon suture.  Pneumoperitoneum is released and the umbilical port is removed.  The #0-vicryl pursestring suture is tied securely.  Good hemostasis is noted at the port sites. Pneumoperitoneum is evacuated.  Skin incisions are anesthetized with local anesthetic.  The supraumbilical incision is closed with interrupted 3-0 Vicryl subcutaneous sutures.  Skin incisions are closed with interrupted 4-0 Monocryl subcuticular sutures. Wounds are washed and dried and Dermabond was applied. The patient is awakened from anesthesia and brought to the recovery room. The patient tolerated the procedure well.  Darnell Level, MD [facility] Office: [phone]

## 2020-11-06 NOTE — ED Triage Notes (Signed)
Pt come via [facility] with c/c of abd pain. Pt is from home with RLQ pain. Per EMS pt has had pain for 2x days with bloating, n/v. Pt is DM hx (BS 322).   90HR, 96%RA

## 2020-11-06 NOTE — Progress Notes (Signed)
Inpatient Diabetes Program Recommendations  AACE/ADA: New Consensus Statement on Inpatient Glycemic Control   Target Ranges:  Prepandial:   less than 140 mg/dL      Peak postprandial:   less than 180 mg/dL (1-2 hours)      Critically ill patients:  140 - 180 mg/dL   Results for Diane Lawson, Diane Lawson (MRN [id number]) as of 11/06/2020 13:47  Ref. Range 11/06/2020 12:43 11/06/2020 13:45  Glucose-Capillary Latest Ref Range: 70 - 99 mg/dL 409 (H) 811 (H)  Results for Diane Lawson, Diane Lawson (MRN [id number]) as of 11/06/2020 13:47  Ref. Range 11/06/2020 07:19  Glucose Latest Ref Range: 70 - 99 mg/dL 213 (H)   Review of Glycemic Control  Diabetes history: DM2 Outpatient Diabetes medications: Janumet 50-1000 mg QHS, 70/30 40 units BID Current orders for Inpatient glycemic control: Novolog 0-15 units TID with meals  Inpatient Diabetes Program Recommendations:    Insulin: Please consider ordering Lantus 30 units Q24H (based on 145 kg x 0.2 units).  NOTE: Patient admitted with perforated appendix and had surgery today. Per chart, patient received Decadron 4 mg x1 today which is contributing to hyperglycemia.   Thanks, Orlando Penner, RN, MSN, CDE Diabetes Coordinator Inpatient Diabetes Program [phone] (Team Pager from 8am to 5pm)

## 2020-11-06 NOTE — Progress Notes (Signed)
MD paged and made aware of pts CBG 402. Pt alert and oriented x4 sitting in recliner chair with no complaints.  New orders given of a bariatric clear liquid diet, give scheduled bedtime Lantus and recheck in four hours.

## 2020-11-06 NOTE — ED Provider Notes (Signed)
[facility] EMERGENCY DEPARTMENT Provider Note   CSN: [id number] Arrival date & time: 11/06/20  0354     History Chief Complaint  Patient presents with  . Abdominal Pain    Pt come via [facility] with c/c of abd pain. Pt is from home with RLQ pain. Per EMS pt has had pain for 2x days with bloating, n/v. Pt is DM hx (BS 322).   90HR, 96%RA     Katricia Collier-Bullis is a 60 y.o. female.  Patient is a 60 year old female who presents with abdominal pain and fever.  She has had a 2-day history of some intermittent pain in her right lower abdomen.  It became more constant starting last night and worsened throughout the morning.  She has had associated nausea and vomiting.  In her right lower abdomen.  Its nonradiating.  She was noted to have a fever in the ED.  She denies any change in her bowels.  No urinary symptoms.  She has had prior C-sections but no other abdominal surgeries.  She has not take anything at home for the pain.        Past Medical History:  Diagnosis Date  . Diabetes mellitus without complication (HCC)   . Hypertension     There are no problems to display for this patient.   Past Surgical History:  Procedure Laterality Date  . BACK SURGERY  2007  . CESAREAN SECTION    . FRACTURE SURGERY     ankle (1999)     OB History   No obstetric history on file.     Family History  Adopted: Yes    Social History   Tobacco Use  . Smoking status: Never Smoker  Substance Use Topics  . Alcohol use: No  . Drug use: No    Home Medications Prior to Admission medications   Medication Sig Start Date End Date Taking? Authorizing Provider  insulin aspart (NOVOLOG) 100 UNIT/ML injection Inject into the skin 2 (two) times daily. Take 20-22 Units    [provider]  lisinopril-hydrochlorothiazide (PRINZIDE,ZESTORETIC) 20-25 MG per tablet Take 1 tablet by mouth daily. 11/05/13   Carmelina Dane, MD  simvastatin (ZOCOR) 80 MG tablet Take 80 mg  by mouth daily.    [provider]  sitaGLIPtin-metformin (JANUMET) 50-1000 MG per tablet Take 1 tablet by mouth 1 day or 1 dose. Take One HS 11/05/13   Carmelina Dane, MD    Allergies    Penicillins  Review of Systems   Review of Systems  Constitutional: Positive for fever. Negative for chills, diaphoresis and fatigue.  HENT: Negative for congestion, rhinorrhea and sneezing.   Eyes: Negative.   Respiratory: Negative for cough, chest tightness and shortness of breath.   Cardiovascular: Negative for chest pain and leg swelling.  Gastrointestinal: Positive for abdominal pain, nausea and vomiting. Negative for blood in stool and diarrhea.  Genitourinary: Negative for difficulty urinating, flank pain, frequency and hematuria.  Musculoskeletal: Negative for arthralgias and back pain.  Skin: Negative for rash.  Neurological: Negative for dizziness, speech difficulty, weakness, numbness and headaches.    Physical Exam Updated Vital Signs BP (!) 159/85   Pulse (!) 107   Temp (!) 101.4 F (38.6 C) (Oral)   Resp (!) 24   Ht 5\' 7"  (1.702 m)   Wt (!) 145.2 kg   SpO2 95%   BMI 50.12 kg/m   Physical Exam Constitutional:      Appearance: She is well-developed.  HENT:     Head: Normocephalic and atraumatic.  Eyes:     Pupils: Pupils are equal, round, and reactive to light.  Cardiovascular:     Rate and Rhythm: Normal rate and regular rhythm.     Heart sounds: Normal heart sounds.  Pulmonary:     Effort: Pulmonary effort is normal. No respiratory distress.     Breath sounds: Normal breath sounds. No wheezing or rales.  Chest:     Chest wall: No tenderness.  Abdominal:     General: Bowel sounds are normal.     Palpations: Abdomen is soft.     Tenderness: There is no abdominal tenderness (tenderness to right mid abdomen). There is no guarding or rebound.  Musculoskeletal:        General: Normal range of motion.     Cervical back: Normal range of motion and neck supple.   Lymphadenopathy:     Cervical: No cervical adenopathy.  Skin:    General: Skin is warm and dry.     Findings: No rash.  Neurological:     Mental Status: She is alert and oriented to person, place, and time.     ED Results / Procedures / Treatments   Labs (all labs ordered are listed, but only abnormal results are displayed) Labs Reviewed  COMPREHENSIVE METABOLIC PANEL - Abnormal; Notable for the following components:      Result Value   Glucose, Bld 298 (*)    Creatinine, Ser 1.12 (*)    Albumin 3.3 (*)    Total Bilirubin 2.1 (*)    GFR, Estimated 57 (*)    All other components within normal limits  LACTIC ACID, PLASMA - Abnormal; Notable for the following components:   Lactic Acid, Venous 2.0 (*)    All other components within normal limits  RESP PANEL BY RT-PCR (FLU A&B, COVID) ARPGX2  LIPASE, BLOOD  CBC WITH DIFFERENTIAL/PLATELET  URINALYSIS, ROUTINE W REFLEX MICROSCOPIC  LACTIC ACID, PLASMA  CBC WITH DIFFERENTIAL/PLATELET    EKG None  Radiology CT Abdomen Pelvis Wo Contrast  Result Date: 11/06/2020 CLINICAL DATA:  Epigastric pain radiating to the right lower quadrant with nausea and vomiting. Fever. Constipation. EXAM: CT ABDOMEN AND PELVIS WITHOUT CONTRAST TECHNIQUE: Multidetector CT imaging of the abdomen and pelvis was performed following the standard protocol without IV contrast. COMPARISON:  None. FINDINGS: Lower chest: Clear lung bases.  Normal heart size. Hepatobiliary: No focal liver abnormality is seen. No gallstones, gallbladder wall thickening, or biliary dilatation. Pancreas: Unremarkable. Spleen: Unremarkable. Adrenals/Urinary Tract: Unremarkable adrenal glands. Mildly lobular contour and mild cortical thinning involving both kidneys. No renal calculi or hydronephrosis. Unremarkable bladder. Stomach/Bowel: The stomach is unremarkable. There is no evidence of bowel obstruction. The appendix is dilated and thick walled measuring 1.5 cm in diameter near its base  with an associated 5 mm appendicolith. There is moderate inflammation in the surrounding fat, and there are multiple small foci of extraluminal gas in this region. Vascular/Lymphatic: Abdominal aortic atherosclerosis without aneurysm. No enlarged lymph nodes. Reproductive: Uterus and bilateral adnexa are unremarkable. Other: Small volume intraperitoneal free fluid in the right lower quadrant. No drainable fluid collection. Musculoskeletal: No acute osseous abnormality or suspicious osseous lesion. Lumbar disc degeneration greatest at L4-5 with a disc osteophyte complex resulting in asymmetric left lateral recess stenosis. 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 IMPRESSION: 1. Perforated acute appendicitis.  No drainable fluid collection. 2. Aortic Atherosclerosis (ICD10-I70.0). These results were called by telephone at the time of interpretation on  11/06/2020 at 8:34 am to Dr. Rolan Bucco, who verbally acknowledged these results. Electronically Signed   By: Sebastian Ache M.D.   On: 11/06/2020 08:36    Procedures Procedures   Medications Ordered in ED Medications  metroNIDAZOLE (FLAGYL) IVPB 500 mg (has no administration in time range)  ceFEPIme (MAXIPIME) 2 g in sodium chloride 0.9 % 100 mL IVPB (2 g Intravenous New Bag/Given 11/06/20 0847)  sodium chloride 0.9 % bolus 1,000 mL (1,000 mLs Intravenous New Bag/Given 11/06/20 0816)  fentaNYL (SUBLIMAZE) injection 50 mcg (50 mcg Intravenous Given 11/06/20 0819)  ondansetron (ZOFRAN) injection 4 mg (4 mg Intravenous Given 11/06/20 0816)    ED Course  I have reviewed the triage vital signs and the nursing notes.  Pertinent labs & imaging results that were available during my care of the patient were reviewed by me and considered in my medical decision making (see chart for details).    MDM Rules/Calculators/A&P                          Patient is a 61 year old female who presents with fever associated with right-sided abdominal pain.  She  has an elevated lactate at 2.2.  She has some tachycardia and meets SIRS criteria for sepsis.  She was treated with cefepime and Flagyl along with IV fluids.  CT scan shows a perforated appendix.  WBC count is still pending.  I spoke with general surgery PA who has accepted the patient for admission. Likely to OR today.  CRITICAL CARE Performed by: Rolan Bucco Total critical care time: 60 minutes Critical care time was exclusive of separately billable procedures and treating other patients. Critical care was necessary to treat or prevent imminent or life-threatening deterioration. Critical care was time spent personally by me on the following activities: development of treatment plan with patient and/or surrogate as well as nursing, discussions with consultants, evaluation of patient's response to treatment, examination of patient, obtaining history from patient or surrogate, ordering and performing treatments and interventions, ordering and review of laboratory studies, ordering and review of radiographic studies, pulse oximetry and re-evaluation of patient's condition.  Final Clinical Impression(s) / ED Diagnoses Final diagnoses:  Perforated appendicitis    Rx / DC Orders ED Discharge Orders    None       Rolan Bucco, MD 11/06/20 [phone]

## 2020-11-06 NOTE — Transfer of Care (Signed)
Immediate Anesthesia Transfer of Care Note  Patient: Diane Lawson  Procedure(s) Performed: APPENDECTOMY LAPAROSCOPIC (N/A )  Patient Location: PACU  Anesthesia Type:General  Level of Consciousness: drowsy  Airway & Oxygen Therapy: Patient Spontanous Breathing and Patient connected to face mask oxygen  Post-op Assessment: Report given to RN and Post -op Vital signs reviewed and stable  Post vital signs: Reviewed and stable  Last Vitals:  Vitals Value Taken Time  BP 155/80 11/06/20 1238  Temp    Pulse    Resp 23 11/06/20 1242  SpO2    Vitals shown include unvalidated device data.  Last Pain:  Vitals:   11/06/20 0957  TempSrc: Oral  PainSc:          Complications: No complications documented.

## 2020-11-06 NOTE — ED Notes (Signed)
Report given to story stay RN

## 2020-11-06 NOTE — Anesthesia Procedure Notes (Addendum)
Procedure Name: Intubation Date/Time: 11/06/2020 11:00 AM Performed by: Candis Shine, CRNA Pre-anesthesia Checklist: Patient identified, Emergency Drugs available, Suction available and Patient being monitored Patient Re-evaluated:Patient Re-evaluated prior to induction Oxygen Delivery Method: Circle System Utilized Preoxygenation: Pre-oxygenation with 100% oxygen Induction Type: IV induction, Rapid sequence and Cricoid Pressure applied Laryngoscope Size: Mac and 3 Grade View: Grade I Tube type: Oral Tube size: 7.0 mm Number of attempts: 1 Airway Equipment and Method: Stylet and Oral airway Placement Confirmation: ETT inserted through vocal cords under direct vision,  positive ETCO2 and breath sounds checked- equal and bilateral Secured at: 22 cm Tube secured with: Tape Dental Injury: Teeth and Oropharynx as per pre-operative assessment

## 2020-11-07 ENCOUNTER — Encounter (HOSPITAL_COMMUNITY): Payer: Self-pay | Admitting: Surgery

## 2020-11-07 LAB — BASIC METABOLIC PANEL
Anion gap: 9 (ref 5–15)
BUN: 20 mg/dL (ref 6–20)
CO2: 24 mmol/L (ref 22–32)
Calcium: 8.3 mg/dL — ABNORMAL LOW (ref 8.9–10.3)
Chloride: 102 mmol/L (ref 98–111)
Creatinine, Ser: 1.3 mg/dL — ABNORMAL HIGH (ref 0.44–1.00)
GFR, Estimated: 48 mL/min — ABNORMAL LOW (ref >60)
Glucose, Bld: 351 mg/dL — ABNORMAL HIGH (ref 70–99)
Potassium: 4.1 mmol/L (ref 3.5–5.1)
Sodium: 135 mmol/L (ref 135–145)

## 2020-11-07 LAB — SURGICAL PATHOLOGY

## 2020-11-07 LAB — CBC
HCT: 36.5 % (ref 36.0–46.0)
Hemoglobin: 12 g/dL (ref 12.0–15.0)
MCH: 29.4 pg (ref 26.0–34.0)
MCHC: 32.9 g/dL (ref 30.0–36.0)
MCV: 89.5 fL (ref 80.0–100.0)
Platelets: 149 10*3/uL — ABNORMAL LOW (ref 150–400)
RBC: 4.08 MIL/uL (ref 3.87–5.11)
RDW: 14.8 % (ref 11.5–15.5)
WBC: 13.4 10*3/uL — ABNORMAL HIGH (ref 4.0–10.5)
nRBC: 0 % (ref 0.0–0.2)

## 2020-11-07 LAB — HIV ANTIBODY (ROUTINE TESTING W REFLEX): HIV Screen 4th Generation wRfx: NONREACTIVE

## 2020-11-07 LAB — GLUCOSE, CAPILLARY
Glucose-Capillary: 306 mg/dL — ABNORMAL HIGH (ref 70–99)
Glucose-Capillary: 255 mg/dL — ABNORMAL HIGH (ref 70–99)
Glucose-Capillary: 231 mg/dL — ABNORMAL HIGH (ref 70–99)
Glucose-Capillary: 206 mg/dL — ABNORMAL HIGH (ref 70–99)
Glucose-Capillary: 206 mg/dL — ABNORMAL HIGH (ref 70–99)

## 2020-11-07 LAB — HEMOGLOBIN A1C
Hgb A1c MFr Bld: 10.1 % — ABNORMAL HIGH (ref 4.8–5.6)
Mean Plasma Glucose: 243 mg/dL

## 2020-11-07 MED ORDER — INSULIN ASPART 100 UNIT/ML IJ SOLN
0.0000 [IU] | Freq: Three times a day (TID) | INTRAMUSCULAR | Status: DC
  Administered 2020-11-07 – 2020-11-10 (×9): 7 [IU] via SUBCUTANEOUS
  Administered 2020-11-10: 4 [IU] via SUBCUTANEOUS
  Administered 2020-11-10: 7 [IU] via SUBCUTANEOUS
  Administered 2020-11-11 (×3): 4 [IU] via SUBCUTANEOUS
  Administered 2020-11-12: 3 [IU] via SUBCUTANEOUS
  Administered 2020-11-13: 4 [IU] via SUBCUTANEOUS
  Administered 2020-11-13: 3 [IU] via SUBCUTANEOUS

## 2020-11-07 MED ORDER — INSULIN ASPART 100 UNIT/ML IJ SOLN
0.0000 [IU] | Freq: Every day | INTRAMUSCULAR | Status: DC
  Administered 2020-11-07 – 2020-11-10 (×3): 2 [IU] via SUBCUTANEOUS

## 2020-11-07 NOTE — Anesthesia Postprocedure Evaluation (Signed)
Anesthesia Post Note  Patient: Diane Lawson  Procedure(s) Performed: APPENDECTOMY LAPAROSCOPIC (N/A )     Patient location during evaluation: PACU Anesthesia Type: General Level of consciousness: awake and alert Pain management: pain level controlled Vital Signs Assessment: post-procedure vital signs reviewed and stable Respiratory status: spontaneous breathing, nonlabored ventilation and respiratory function stable Cardiovascular status: blood pressure returned to baseline and stable Postop Assessment: no apparent nausea or vomiting Anesthetic complications: no   No complications documented.  Last Vitals:  Vitals:   11/07/20 0019 11/07/20 0422  BP: 123/72 133/76  Pulse: 87 85  Resp: 18 16  Temp: 37.1 C 36.6 C  SpO2: 94% 94%    Last Pain:  Vitals:   11/07/20 0422  TempSrc: Oral  PainSc:    Pain Goal: Patients Stated Pain Goal: 3 (11/06/20 2218)                 [name]

## 2020-11-07 NOTE — Progress Notes (Signed)
Progress Note  1 Day Post-Op  Subjective: Patient reports abdomen is sore but pain well controlled with medication. She denies nausea or vomiting with CLD. She is not passing flatus or having bowel function yet. She has gotten up and ambulated some and agrees to ambulate more today.   Objective: Vital signs in last 24 hours: Temp:  [97.9 F (36.6 C)-100.3 F (37.9 C)] 97.9 F (36.6 C) (06/03 0422) Pulse Rate:  [85-98] 85 (06/03 0422) Resp:  [16-22] 16 (06/03 0422) BP: (112-155)/(53-80) 133/76 (06/03 0422) SpO2:  [92 %-99 %] 94 % (06/03 0422)    Intake/Output from previous day: 06/02 0701 - 06/03 0700 In: 3791.3 [P.O.:150; I.V.:2041.3; IV Piggyback:1600] Out: 915 [Urine:800; Drains:110; Blood:5] Intake/Output this shift: No intake/output data recorded.  PE: General: pleasant, WD, morbidly obese female who is sitting up in chair Heart: regular, rate, and rhythm.   Lungs: Respiratory effort nonlabored Abd: soft, appropriately ttp, mildly distended, incisions c/d/i, drain with purulent appearing fluid this AM MS: all 4 extremities are symmetrical with no cyanosis, clubbing, or edema. Skin: warm and dry with no masses, lesions, or rashes Psych: A&Ox3 with an appropriate affect.    Lab Results:  Recent Labs    11/06/20 0806 11/07/20 0043  WBC 6.4 13.4*  HGB 18.4* 12.0  HCT 56.1* 36.5  PLT 99* 149*   BMET Recent Labs    11/06/20 0719 11/07/20 0043  NA 135 135  K 4.1 4.1  CL 100 102  CO2 22 24  GLUCOSE 298* 351*  BUN 17 20  CREATININE 1.12* 1.30*  CALCIUM 9.2 8.3*   PT/INR No results for input(s): LABPROT, INR in the last 72 hours. CMP     Component Value Date/Time   NA 135 11/07/2020 0043   K 4.1 11/07/2020 0043   CL 102 11/07/2020 0043   CO2 24 11/07/2020 0043   GLUCOSE 351 (H) 11/07/2020 0043   BUN 20 11/07/2020 0043   CREATININE 1.30 (H) 11/07/2020 0043   CALCIUM 8.3 (L) 11/07/2020 0043   PROT 6.7 11/06/2020 0719   ALBUMIN 3.3 (L) 11/06/2020  0719   AST 19 11/06/2020 0719   ALT 23 11/06/2020 0719   ALKPHOS 94 11/06/2020 0719   BILITOT 2.1 (H) 11/06/2020 0719   GFRNONAA 48 (L) 11/07/2020 0043   Lipase     Component Value Date/Time   LIPASE 24 11/06/2020 0719       Studies/Results: CT Abdomen Pelvis Wo Contrast  Result Date: 11/06/2020 CLINICAL DATA:  Epigastric pain radiating to the right lower quadrant with nausea and vomiting. Fever. Constipation. EXAM: CT ABDOMEN AND PELVIS WITHOUT CONTRAST TECHNIQUE: Multidetector CT imaging of the abdomen and pelvis was performed following the standard protocol without IV contrast. COMPARISON:  None. FINDINGS: Lower chest: Clear lung bases.  Normal heart size. Hepatobiliary: No focal liver abnormality is seen. No gallstones, gallbladder wall thickening, or biliary dilatation. Pancreas: Unremarkable. Spleen: Unremarkable. Adrenals/Urinary Tract: Unremarkable adrenal glands. Mildly lobular contour and mild cortical thinning involving both kidneys. No renal calculi or hydronephrosis. Unremarkable bladder. Stomach/Bowel: The stomach is unremarkable. There is no evidence of bowel obstruction. The appendix is dilated and thick walled measuring 1.5 cm in diameter near its base with an associated 5 mm appendicolith. There is moderate inflammation in the surrounding fat, and there are multiple small foci of extraluminal gas in this region. Vascular/Lymphatic: Abdominal aortic atherosclerosis without aneurysm. No enlarged lymph nodes. Reproductive: Uterus and bilateral adnexa are unremarkable. Other: Small volume intraperitoneal free fluid in the right  lower quadrant. No drainable fluid collection. Musculoskeletal: No acute osseous abnormality or suspicious osseous lesion. Lumbar disc degeneration greatest at L4-5 with a disc osteophyte complex resulting in asymmetric left lateral recess stenosis. 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 IMPRESSION: 1. Perforated acute appendicitis.  No  drainable fluid collection. 2. Aortic Atherosclerosis (ICD10-I70.0). These results were called by telephone at the time of interpretation on 11/06/2020 at 8:34 am to Dr. Rolan Bucco, who verbally acknowledged these results. Electronically Signed   By: Sebastian Ache M.D.   On: 11/06/2020 08:36    Anti-infectives: Anti-infectives (From admission, onward)   Start     Dose/Rate Route Frequency Ordered Stop   11/06/20 1730  metroNIDAZOLE (FLAGYL) IVPB 500 mg       "And" Linked Group Details   500 mg 100 mL/hr over 60 Minutes Intravenous Every 8 hours 11/06/20 0932     11/06/20 1630  ceFEPIme (MAXIPIME) 2 g in sodium chloride 0.9 % 100 mL IVPB       "And" Linked Group Details   2 g 200 mL/hr over 30 Minutes Intravenous Every 8 hours 11/06/20 0932     11/06/20 1245  ceFEPIme (MAXIPIME) 2 g in sodium chloride 0.9 % 100 mL IVPB        2 g 200 mL/hr over 30 Minutes Intravenous  Once 11/06/20 1240 11/06/20 1437   11/06/20 1245  metroNIDAZOLE (FLAGYL) IVPB 500 mg        500 mg 100 mL/hr over 60 Minutes Intravenous  Once 11/06/20 1241     11/06/20 0845  metroNIDAZOLE (FLAGYL) IVPB 500 mg        500 mg 100 mL/hr over 60 Minutes Intravenous  Once 11/06/20 0837 11/06/20 1028   11/06/20 0845  ceFEPIme (MAXIPIME) 2 g in sodium chloride 0.9 % 100 mL IVPB        2 g 200 mL/hr over 30 Minutes Intravenous  Once 11/06/20 6283 11/06/20 0919       Assessment/Plan Acute perforated appendicitis POD1 laparoscopic appendectomy with drain placement 6/2 Dr. Gerrit Friends - patient tolerating CLD - ok to advance as tolerated to FLD - monitor drain output - if clears then may be able to remove prior to discharge, if remains murky then would leave until she follows up in the office - WBC 13 this AM, continue to monitor - continue IV abx - mobilize today - await return in bowel function - not ready for discharge today  FEN - CLD and ADAT to FLD/IVFs/SSI VTE - lovenox  ID - cefipime/flagyl 6/2>> Admit -  inpatient  HTN - resume home meds DM - change SSI to obese, continue lantus 30 units and monitor   LOS: 1 day    Juliet Rude, [facility] 11/07/2020, 10:33 AM Please see Amion for pager number during day hours 7:00am-4:30pm

## 2020-11-07 NOTE — Discharge Instructions (Signed)
[facility]  Please arrive at least 30 min before your appointment to complete your check in paperwork.  If you are unable to arrive 30 min prior to your appointment time we may have to cancel or reschedule you. LAPAROSCOPIC SURGERY: POST OP INSTRUCTIONS Always review your discharge instruction sheet given to you by the facility where your surgery was performed. IF YOU HAVE DISABILITY OR FAMILY LEAVE FORMS, YOU MUST BRING THEM TO THE OFFICE FOR PROCESSING.   DO NOT GIVE THEM TO YOUR DOCTOR.  PAIN CONTROL  1. First take acetaminophen (Tylenol) AND/or ibuprofen (Advil) to control your pain after surgery.  Follow directions on package.  Taking acetaminophen (Tylenol) and/or ibuprofen (Advil) regularly after surgery will help to control your pain and lower the amount of prescription pain medication you may need.  You should not take more than 4,000 mg (4 grams) of acetaminophen (Tylenol) in 24 hours.  You should not take ibuprofen (Advil), aleve, motrin, naprosyn or other NSAIDS if you have a history of stomach ulcers or chronic kidney disease.  2. A prescription for pain medication may be given to you upon discharge.  Take your pain medication as prescribed, if you still have uncontrolled pain after taking acetaminophen (Tylenol) or ibuprofen (Advil). 3. Use ice packs to help control pain. 4. If you need a refill on your pain medication, please contact your pharmacy.  They will contact our office to request authorization. Prescriptions will not be filled after 5pm or on week-ends.  HOME MEDICATIONS 5. Take your usually prescribed medications unless otherwise directed.  DIET 6. You should follow a light diet the first few days after arrival home.  Be sure to include lots of fluids daily. Avoid fatty, fried foods.   CONSTIPATION 7. It is common to experience some constipation after surgery and if you are taking pain medication.  Increasing fluid intake and taking a stool  softener (such as Colace) will usually help or prevent this problem from occurring.  A mild laxative (Milk of Magnesia or Miralax) should be taken according to package instructions if there are no bowel movements after 48 hours.  WOUND/INCISION CARE 8. Most patients will experience some swelling and bruising in the area of the incisions.  Ice packs will help.  Swelling and bruising can take several days to resolve.  9. Unless discharge instructions indicate otherwise, follow guidelines below  a. STERI-STRIPS - you may remove your outer bandages 48 hours after surgery, and you may shower at that time.  You have steri-strips (small skin tapes) in place directly over the incision.  These strips should be left on the skin for 7-10 days.   b. DERMABOND/SKIN GLUE - you may shower in 24 hours.  The glue will flake off over the next 2-3 weeks. 10. Any sutures or staples will be removed at the office during your follow-up visit.  ACTIVITIES 11. You may resume regular (light) daily activities beginning the next day--such as daily self-care, walking, climbing stairs--gradually increasing activities as tolerated.  You may have sexual intercourse when it is comfortable.  Refrain from any heavy lifting or straining until approved by your doctor. a. You may drive when you are no longer taking prescription pain medication, you can comfortably wear a seatbelt, and you can safely maneuver your car and apply brakes.  FOLLOW-UP 12. You should see your doctor in the office for a follow-up appointment approximately 2-3 weeks after your surgery.  You should have been given your post-op/follow-up appointment when   your surgery was scheduled.  If you did not receive a post-op/follow-up appointment, make sure that you call for this appointment within a day or two after you arrive home to insure a convenient appointment time.   WHEN TO CALL YOUR DOCTOR: 1. Fever over 101.0 2. Inability to urinate 3. Continued bleeding from  incision. 4. Increased pain, redness, or drainage from the incision. 5. Increasing abdominal pain  The clinic staff is available to answer your questions during regular business hours.  Please don't hesitate to call and ask to speak to one of the nurses for clinical concerns.  If you have a medical emergency, go to the nearest emergency room or call 911.  A surgeon from [facility] is always on call at the hospital. [address], [address], [place], [place]  [address] ? [address] [address], [place], [place]   [address] [phone] ? [phone] ? FAX [phone]  .........   Managing Your Pain After Surgery Without Opioids    Thank you for participating in our program to help patients manage their pain after surgery without opioids. This is part of our effort to provide you with the best care possible, without exposing you or your family to the risk that opioids pose.  What pain can I expect after surgery? You can expect to have some pain after surgery. This is normal. The pain is typically worse the day after surgery, and quickly begins to get better. Many studies have found that many patients are able to manage their pain after surgery with Over-the-Counter (OTC) medications such as Tylenol and Motrin. If you have a condition that does not allow you to take Tylenol or Motrin, notify your surgical team.  How will I manage my pain? The best strategy for controlling your pain after surgery is around the clock pain control with Tylenol (acetaminophen) and Motrin (ibuprofen or Advil). Alternating these medications with each other allows you to maximize your pain control. In addition to Tylenol and Motrin, you can use heating pads or ice packs on your incisions to help reduce your pain.  How will I alternate your regular strength over-the-counter pain medication? You will take a dose of pain medication every three hours. ; Start by taking 650 mg of Tylenol (2 pills of 325  mg) ; 3 hours later take 600 mg of Motrin (3 pills of 200 mg) ; 3 hours after taking the Motrin take 650 mg of Tylenol ; 3 hours after that take 600 mg of Motrin.   - 1 -  See example - if your first dose of Tylenol is at 12:00 PM   12:00 PM Tylenol 650 mg (2 pills of 325 mg)  3:00 PM Motrin 600 mg (3 pills of 200 mg)  6:00 PM Tylenol 650 mg (2 pills of 325 mg)  9:00 PM Motrin 600 mg (3 pills of 200 mg)  Continue alternating every 3 hours   We recommend that you follow this schedule around-the-clock for at least 3 days after surgery, or until you feel that it is no longer needed. Use the table on the last page of this handout to keep track of the medications you are taking. Important: Do not take more than 3000mg of Tylenol or 3200mg of Motrin in a 24-hour period. Do not take ibuprofen/Motrin if you have a history of bleeding stomach ulcers, severe kidney disease, &/or actively taking a blood thinner  What if I still have pain? If you have pain that is not   controlled with the over-the-counter pain medications (Tylenol and Motrin or Advil) you might have what we call "breakthrough" pain. You will receive a prescription for a small amount of an opioid pain medication such as Oxycodone, Tramadol, or Tylenol with Codeine. Use these opioid pills in the first 24 hours after surgery if you have breakthrough pain. Do not take more than 1 pill every 4-6 hours.  If you still have uncontrolled pain after using all opioid pills, don't hesitate to call our staff using the number provided. We will help make sure you are managing your pain in the best way possible, and if necessary, we can provide a prescription for additional pain medication.   Day 1    Time  Name of Medication Number of pills taken  Amount of Acetaminophen  Pain Level   Comments  AM PM       AM PM       AM PM       AM PM       AM PM       AM PM       AM PM       AM PM       Total Daily amount of Acetaminophen Do not  take more than  3,000 mg per day      Day 2    Time  Name of Medication Number of pills taken  Amount of Acetaminophen  Pain Level   Comments  AM PM       AM PM       AM PM       AM PM       AM PM       AM PM       AM PM       AM PM       Total Daily amount of Acetaminophen Do not take more than  3,000 mg per day      Day 3    Time  Name of Medication Number of pills taken  Amount of Acetaminophen  Pain Level   Comments  AM PM       AM PM       AM PM       AM PM          AM PM       AM PM       AM PM       AM PM       Total Daily amount of Acetaminophen Do not take more than  3,000 mg per day      Day 4    Time  Name of Medication Number of pills taken  Amount of Acetaminophen  Pain Level   Comments  AM PM       AM PM       AM PM       AM PM       AM PM       AM PM       AM PM       AM PM       Total Daily amount of Acetaminophen Do not take more than  3,000 mg per day      Day 5    Time  Name of Medication Number of pills taken  Amount of Acetaminophen  Pain Level   Comments  AM PM       AM PM       AM   PM       AM PM       AM PM       AM PM       AM PM       AM PM       Total Daily amount of Acetaminophen Do not take more than  3,000 mg per day       Day 6    Time  Name of Medication Number of pills taken  Amount of Acetaminophen  Pain Level  Comments  AM PM       AM PM       AM PM       AM PM       AM PM       AM PM       AM PM       AM PM       Total Daily amount of Acetaminophen Do not take more than  3,000 mg per day      Day 7    Time  Name of Medication Number of pills taken  Amount of Acetaminophen  Pain Level   Comments  AM PM       AM PM       AM PM       AM PM       AM PM       AM PM       AM PM       AM PM       Total Daily amount of Acetaminophen Do not take more than  3,000 mg per day        For additional information about how and where to safely dispose of unused  opioid medications - https://www.morepowerfulnc.org  Disclaimer: This document contains information and/or instructional materials adapted from [facility] for the typical patient with your condition. It does not replace medical advice from your health care provider because your experience may differ from that of the typical patient. Talk to your health care provider if you have any questions about this document, your condition or your treatment plan. Adapted from [facility]   

## 2020-11-08 ENCOUNTER — Inpatient Hospital Stay (HOSPITAL_COMMUNITY): Payer: Managed Care, Other (non HMO)

## 2020-11-08 LAB — CBC
HCT: 36.5 % (ref 36.0–46.0)
Hemoglobin: 12.1 g/dL (ref 12.0–15.0)
MCH: 29.7 pg (ref 26.0–34.0)
MCHC: 33.2 g/dL (ref 30.0–36.0)
MCV: 89.7 fL (ref 80.0–100.0)
Platelets: 169 10*3/uL (ref 150–400)
RBC: 4.07 MIL/uL (ref 3.87–5.11)
RDW: 14.9 % (ref 11.5–15.5)
WBC: 15.2 10*3/uL — ABNORMAL HIGH (ref 4.0–10.5)
nRBC: 0 % (ref 0.0–0.2)

## 2020-11-08 LAB — BASIC METABOLIC PANEL
Anion gap: 10 (ref 5–15)
BUN: 22 mg/dL — ABNORMAL HIGH (ref 6–20)
CO2: 24 mmol/L (ref 22–32)
Calcium: 8.2 mg/dL — ABNORMAL LOW (ref 8.9–10.3)
Chloride: 101 mmol/L (ref 98–111)
Creatinine, Ser: 1.22 mg/dL — ABNORMAL HIGH (ref 0.44–1.00)
GFR, Estimated: 51 mL/min — ABNORMAL LOW (ref >60)
Glucose, Bld: 233 mg/dL — ABNORMAL HIGH (ref 70–99)
Potassium: 4 mmol/L (ref 3.5–5.1)
Sodium: 135 mmol/L (ref 135–145)

## 2020-11-08 LAB — GLUCOSE, CAPILLARY
Glucose-Capillary: 227 mg/dL — ABNORMAL HIGH (ref 70–99)
Glucose-Capillary: 212 mg/dL — ABNORMAL HIGH (ref 70–99)
Glucose-Capillary: 232 mg/dL — ABNORMAL HIGH (ref 70–99)
Glucose-Capillary: 196 mg/dL — ABNORMAL HIGH (ref 70–99)

## 2020-11-08 MED ORDER — PROCHLORPERAZINE EDISYLATE 10 MG/2ML IJ SOLN
10.0000 mg | Freq: Four times a day (QID) | INTRAMUSCULAR | Status: DC | PRN
  Administered 2020-11-08 – 2020-11-09 (×5): 10 mg via INTRAVENOUS
  Filled 2020-11-08 (×5): qty 2

## 2020-11-08 MED ORDER — INSULIN GLARGINE 100 UNIT/ML ~~LOC~~ SOLN
40.0000 [IU] | Freq: Every day | SUBCUTANEOUS | Status: DC
  Administered 2020-11-08: 40 [IU] via SUBCUTANEOUS
  Filled 2020-11-08 (×2): qty 0.4

## 2020-11-08 MED ORDER — DOCUSATE SODIUM 100 MG PO CAPS
100.0000 mg | ORAL_CAPSULE | Freq: Two times a day (BID) | ORAL | Status: DC
  Administered 2020-11-08 – 2020-11-10 (×6): 100 mg via ORAL
  Filled 2020-11-08 (×6): qty 1

## 2020-11-08 MED ORDER — POLYETHYLENE GLYCOL 3350 17 G PO PACK
17.0000 g | PACK | Freq: Every day | ORAL | Status: DC
  Administered 2020-11-09 – 2020-11-11 (×3): 17 g via ORAL
  Filled 2020-11-08 (×5): qty 1

## 2020-11-08 NOTE — Progress Notes (Signed)
Progress Note  2 Days Post-Op  Subjective: Patient reports increased nausea today. No emesis. She is passing some flatus. Did not walk much yesterday because she didn't feel well. Encouraged ambulation today. She has a headache this AM.   Objective: Vital signs in last 24 hours: Temp:  [98.4 F (36.9 C)-99.8 F (37.7 C)] 98.4 F (36.9 C) (06/04 0555) Pulse Rate:  [89-106] 89 (06/04 0555) Resp:  [16-18] 18 (06/04 0555) BP: (133-160)/(74-85) 160/83 (06/04 0555) SpO2:  [93 %-96 %] 96 % (06/04 0555) Last BM Date: 11/05/20  Intake/Output from previous day: 06/03 0701 - 06/04 0700 In: 240 [P.O.:240] Out: 865 [Urine:700; Drains:165] Intake/Output this shift: No intake/output data recorded.  PE: General: pleasant, WD, morbidly obese female who is sitting up in chair Heart: regular, rate, and rhythm.   Lungs: Respiratory effort nonlabored Abd: soft, appropriately ttp, mildly distended, incisions c/d/i, drain with more serous fluid this AM MS: all 4 extremities are symmetrical with no cyanosis, clubbing, or edema. Skin: warm and dry with no masses, lesions, or rashes Psych: A&Ox3 with an appropriate affect.    Lab Results:  Recent Labs    11/07/20 0043 11/08/20 0138  WBC 13.4* 15.2*  HGB 12.0 12.1  HCT 36.5 36.5  PLT 149* 169   BMET Recent Labs    11/07/20 0043 11/08/20 0138  NA 135 135  K 4.1 4.0  CL 102 101  CO2 24 24  GLUCOSE 351* 233*  BUN 20 22*  CREATININE 1.30* 1.22*  CALCIUM 8.3* 8.2*   PT/INR No results for input(s): LABPROT, INR in the last 72 hours. CMP     Component Value Date/Time   NA 135 11/08/2020 0138   K 4.0 11/08/2020 0138   CL 101 11/08/2020 0138   CO2 24 11/08/2020 0138   GLUCOSE 233 (H) 11/08/2020 0138   BUN 22 (H) 11/08/2020 0138   CREATININE 1.22 (H) 11/08/2020 0138   CALCIUM 8.2 (L) 11/08/2020 0138   PROT 6.7 11/06/2020 0719   ALBUMIN 3.3 (L) 11/06/2020 0719   AST 19 11/06/2020 0719   ALT 23 11/06/2020 0719   ALKPHOS 94  11/06/2020 0719   BILITOT 2.1 (H) 11/06/2020 0719   GFRNONAA 51 (L) 11/08/2020 0138   Lipase     Component Value Date/Time   LIPASE 24 11/06/2020 0719       Studies/Results: CT Abdomen Pelvis Wo Contrast  Result Date: 11/06/2020 CLINICAL DATA:  Epigastric pain radiating to the right lower quadrant with nausea and vomiting. Fever. Constipation. EXAM: CT ABDOMEN AND PELVIS WITHOUT CONTRAST TECHNIQUE: Multidetector CT imaging of the abdomen and pelvis was performed following the standard protocol without IV contrast. COMPARISON:  None. FINDINGS: Lower chest: Clear lung bases.  Normal heart size. Hepatobiliary: No focal liver abnormality is seen. No gallstones, gallbladder wall thickening, or biliary dilatation. Pancreas: Unremarkable. Spleen: Unremarkable. Adrenals/Urinary Tract: Unremarkable adrenal glands. Mildly lobular contour and mild cortical thinning involving both kidneys. No renal calculi or hydronephrosis. Unremarkable bladder. Stomach/Bowel: The stomach is unremarkable. There is no evidence of bowel obstruction. The appendix is dilated and thick walled measuring 1.5 cm in diameter near its base with an associated 5 mm appendicolith. There is moderate inflammation in the surrounding fat, and there are multiple small foci of extraluminal gas in this region. Vascular/Lymphatic: Abdominal aortic atherosclerosis without aneurysm. No enlarged lymph nodes. Reproductive: Uterus and bilateral adnexa are unremarkable. Other: Small volume intraperitoneal free fluid in the right lower quadrant. No drainable fluid collection. Musculoskeletal: No acute osseous abnormality  or suspicious osseous lesion. Lumbar disc degeneration greatest at L4-5 with a disc osteophyte complex resulting in asymmetric left lateral recess stenosis. 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 IMPRESSION: 1. Perforated acute appendicitis.  No drainable fluid collection. 2. Aortic Atherosclerosis (ICD10-I70.0). These  results were called by telephone at the time of interpretation on 11/06/2020 at 8:34 am to Dr. Rolan Bucco, who verbally acknowledged these results. Electronically Signed   By: Sebastian Ache M.D.   On: 11/06/2020 08:36    Anti-infectives: Anti-infectives (From admission, onward)   Start     Dose/Rate Route Frequency Ordered Stop   11/06/20 1730  metroNIDAZOLE (FLAGYL) IVPB 500 mg       "And" Linked Group Details   500 mg 100 mL/hr over 60 Minutes Intravenous Every 8 hours 11/06/20 0932     11/06/20 1630  ceFEPIme (MAXIPIME) 2 g in sodium chloride 0.9 % 100 mL IVPB       "And" Linked Group Details   2 g 200 mL/hr over 30 Minutes Intravenous Every 8 hours 11/06/20 0932     11/06/20 1245  ceFEPIme (MAXIPIME) 2 g in sodium chloride 0.9 % 100 mL IVPB        2 g 200 mL/hr over 30 Minutes Intravenous  Once 11/06/20 1240 11/06/20 1437   11/06/20 1245  metroNIDAZOLE (FLAGYL) IVPB 500 mg        500 mg 100 mL/hr over 60 Minutes Intravenous  Once 11/06/20 1241     11/06/20 0845  metroNIDAZOLE (FLAGYL) IVPB 500 mg        500 mg 100 mL/hr over 60 Minutes Intravenous  Once 11/06/20 0837 11/06/20 1028   11/06/20 0845  ceFEPIme (MAXIPIME) 2 g in sodium chloride 0.9 % 100 mL IVPB        2 g 200 mL/hr over 30 Minutes Intravenous  Once 11/06/20 2563 11/06/20 0919       Assessment/Plan Acute perforated appendicitis POD2 laparoscopic appendectomy with drain placement 6/2 Dr. Gerrit Friends - patient tolerating FLD and passing flatus but having a lot of nausea this AM - check KUB and encourage ambulation  - monitor drain output - more serous today  - WBC 15 this AM from 13 yesterday, continue to monitor - continue IV abx - not ready for discharge today  FEN - FLD/IVFs/SSI VTE -lovenox  ID -cefipime/flagyl 6/2>>  HTN - home meds DM - CBGs improving but still in the 200s, re-consult diabetes coordinator    LOS: 2 days    Juliet Rude, [facility] 11/08/2020, 8:22 AM Please see  Amion for pager number during day hours 7:00am-4:30pm

## 2020-11-08 NOTE — Plan of Care (Signed)

## 2020-11-08 NOTE — Progress Notes (Signed)
Inpatient Diabetes Program Recommendations  AACE/ADA: New Consensus Statement on Inpatient Glycemic Control   Target Ranges:  Prepandial:   less than 140 mg/dL      Peak postprandial:   less than 180 mg/dL (1-2 hours)      Critically ill patients:  140 - 180 mg/dL     Review of Glycemic Control Results for Diane Lawson, Diane Lawson (MRN [id number]) as of 11/08/2020 10:50  Ref. Range 11/07/2020 08:32 11/07/2020 11:47 11/07/2020 17:32 11/07/2020 21:49 11/08/2020 07:45  Glucose-Capillary Latest Ref Range: 70 - 99 mg/dL 355 (H) 974 (H) 163 (H) 206 (H) 227 (H)   Diabetes history: DM2 Outpatient Diabetes medications: Janumet 50-1000 mg QHS, 70/30 40 units BID Current orders for Inpatient glycemic control: Novolog 0-15 units TID with meals  Inpatient Diabetes Program Recommendations:    Insulin: pt received a total of 31 units of Novolog in past 24 hours. Based on home insulin doses pt has the equivalent of 56 units of basal insulin in 70/30 dose  -  Increase Lantus to 40 units   Thanks, Christena Deem RN, MSN, BC-ADM Inpatient Diabetes Coordinator Team Pager [phone] (8a-5p)

## 2020-11-09 LAB — CBC
HCT: 36.7 % (ref 36.0–46.0)
Hemoglobin: 12.3 g/dL (ref 12.0–15.0)
MCH: 29.8 pg (ref 26.0–34.0)
MCHC: 33.5 g/dL (ref 30.0–36.0)
MCV: 88.9 fL (ref 80.0–100.0)
Platelets: 191 10*3/uL (ref 150–400)
RBC: 4.13 MIL/uL (ref 3.87–5.11)
RDW: 14.7 % (ref 11.5–15.5)
WBC: 14.4 10*3/uL — ABNORMAL HIGH (ref 4.0–10.5)
nRBC: 0 % (ref 0.0–0.2)

## 2020-11-09 LAB — BASIC METABOLIC PANEL
Anion gap: 8 (ref 5–15)
BUN: 24 mg/dL — ABNORMAL HIGH (ref 6–20)
CO2: 24 mmol/L (ref 22–32)
Calcium: 8.2 mg/dL — ABNORMAL LOW (ref 8.9–10.3)
Chloride: 104 mmol/L (ref 98–111)
Creatinine, Ser: 0.99 mg/dL (ref 0.44–1.00)
GFR, Estimated: >60 mL/min (ref >60)
Glucose, Bld: 229 mg/dL — ABNORMAL HIGH (ref 70–99)
Potassium: 4.3 mmol/L (ref 3.5–5.1)
Sodium: 136 mmol/L (ref 135–145)

## 2020-11-09 LAB — GLUCOSE, CAPILLARY
Glucose-Capillary: 236 mg/dL — ABNORMAL HIGH (ref 70–99)
Glucose-Capillary: 207 mg/dL — ABNORMAL HIGH (ref 70–99)
Glucose-Capillary: 212 mg/dL — ABNORMAL HIGH (ref 70–99)
Glucose-Capillary: 238 mg/dL — ABNORMAL HIGH (ref 70–99)

## 2020-11-09 MED ORDER — CALCIUM CARBONATE ANTACID 500 MG PO CHEW
1.0000 | CHEWABLE_TABLET | ORAL | Status: DC | PRN
  Administered 2020-11-09: 200 mg via ORAL
  Filled 2020-11-09: qty 1

## 2020-11-09 MED ORDER — INSULIN GLARGINE 100 UNIT/ML ~~LOC~~ SOLN
50.0000 [IU] | Freq: Every day | SUBCUTANEOUS | Status: DC
  Administered 2020-11-09 – 2020-11-12 (×4): 50 [IU] via SUBCUTANEOUS
  Filled 2020-11-09 (×5): qty 0.5

## 2020-11-09 MED ORDER — POTASSIUM CHLORIDE IN NACL 20-0.9 MEQ/L-% IV SOLN
INTRAVENOUS | Status: DC
  Filled 2020-11-09 (×2): qty 1000

## 2020-11-09 NOTE — Progress Notes (Signed)
Progress Note  3 Days Post-Op  Subjective: Patient having a little less nausea this AM but still some. One episode of emesis but still passing flatus. Has not been out of chair.   Objective: Vital signs in last 24 hours: Temp:  [97.3 F (36.3 C)-97.8 F (36.6 C)] 97.3 F (36.3 C) (06/05 0401) Pulse Rate:  [86-93] 93 (06/05 0401) Resp:  [16-18] 18 (06/05 0401) BP: (148-163)/(76-123) 163/92 (06/05 0401) SpO2:  [96 %-98 %] 97 % (06/05 0401) Last BM Date: 11/05/20  Intake/Output from previous day: 06/04 0701 - 06/05 0700 In: 1656 [P.O.:360; I.V.:1096; IV Piggyback:200] Out: 1375 [Urine:700; Drains:675] Intake/Output this shift: No intake/output data recorded.  PE: General: pleasant, WD,morbidly obesefemale who is sitting up in chair Heart: regular, rate, and rhythm.  Lungs: Respiratory effort nonlabored Abd: soft,appropriately ttp,mild-moderately distended, incisions c/d/i, drain with more serous fluid this AM MS: all 4 extremities are symmetrical with no cyanosis, clubbing, or edema. Skin: warm and dry with no masses, lesions, or rashes Psych: A&Ox3 with an appropriate affect.     Lab Results:  Recent Labs    11/08/20 0138 11/09/20 0043  WBC 15.2* 14.4*  HGB 12.1 12.3  HCT 36.5 36.7  PLT 169 191   BMET Recent Labs    11/08/20 0138 11/09/20 0043  NA 135 136  K 4.0 4.3  CL 101 104  CO2 24 24  GLUCOSE 233* 229*  BUN 22* 24*  CREATININE 1.22* 0.99  CALCIUM 8.2* 8.2*   PT/INR No results for input(s): LABPROT, INR in the last 72 hours. CMP     Component Value Date/Time   NA 136 11/09/2020 0043   K 4.3 11/09/2020 0043   CL 104 11/09/2020 0043   CO2 24 11/09/2020 0043   GLUCOSE 229 (H) 11/09/2020 0043   BUN 24 (H) 11/09/2020 0043   CREATININE 0.99 11/09/2020 0043   CALCIUM 8.2 (L) 11/09/2020 0043   PROT 6.7 11/06/2020 0719   ALBUMIN 3.3 (L) 11/06/2020 0719   AST 19 11/06/2020 0719   ALT 23 11/06/2020 0719   ALKPHOS 94 11/06/2020 0719    BILITOT 2.1 (H) 11/06/2020 0719   GFRNONAA >60 11/09/2020 0043   Lipase     Component Value Date/Time   LIPASE 24 11/06/2020 0719       Studies/Results: DG Abd Portable 1V  Result Date: 11/08/2020 CLINICAL DATA:  Abdominal pain with nausea and vomiting. Recent appendectomy EXAM: PORTABLE ABDOMEN - 1 VIEW COMPARISON:  CT abdomen and pelvis November 06, 2020 FINDINGS: There is moderate air in the transverse colon. There is no bowel dilatation or air-fluid level to indicate bowel obstruction. No free air evident on supine examination. Lung bases clear. No abnormal calcifications. IMPRESSION: No bowel obstruction or free air evident on supine examination. Lung bases clear. Electronically Signed   By: Bretta Bang III M.D.   On: 11/08/2020 10:26    Anti-infectives: Anti-infectives (From admission, onward)   Start     Dose/Rate Route Frequency Ordered Stop   11/06/20 1730  metroNIDAZOLE (FLAGYL) IVPB 500 mg       "And" Linked Group Details   500 mg 100 mL/hr over 60 Minutes Intravenous Every 8 hours 11/06/20 0932     11/06/20 1630  ceFEPIme (MAXIPIME) 2 g in sodium chloride 0.9 % 100 mL IVPB       "And" Linked Group Details   2 g 200 mL/hr over 30 Minutes Intravenous Every 8 hours 11/06/20 0932     11/06/20 1245  ceFEPIme (  MAXIPIME) 2 g in sodium chloride 0.9 % 100 mL IVPB        2 g 200 mL/hr over 30 Minutes Intravenous  Once 11/06/20 1240 11/06/20 1437   11/06/20 1245  metroNIDAZOLE (FLAGYL) IVPB 500 mg        500 mg 100 mL/hr over 60 Minutes Intravenous  Once 11/06/20 1241     11/06/20 0845  metroNIDAZOLE (FLAGYL) IVPB 500 mg        500 mg 100 mL/hr over 60 Minutes Intravenous  Once 11/06/20 0837 11/06/20 1028   11/06/20 0845  ceFEPIme (MAXIPIME) 2 g in sodium chloride 0.9 % 100 mL IVPB        2 g 200 mL/hr over 30 Minutes Intravenous  Once 11/06/20 7425 11/06/20 0919       Assessment/Plan Acute perforated appendicitis POD3 laparoscopic appendectomy with drain placement 6/2  Dr. Gerrit Friends - patient tolerating FLD and passing flatus, still having some nausea - keep on FLD - NEEDS TO MOBILIZE  - monitor drain output - more serous today  - WBC 14, continue to monitor - continue IV abx - not ready for discharge today  FEN - FLD/decrease IVF to 50cc/h VTE -lovenox  ID -cefipime/flagyl6/2>>  HTN - home meds DM -SSI, CBGs improving, appreciate assistance from diabetes coordinator, increased lantus to 50 units   LOS: 3 days    Juliet Rude, [facility] 11/09/2020, 10:03 AM Please see Amion for pager number during day hours 7:00am-4:30pm

## 2020-11-09 NOTE — Plan of Care (Signed)
  Problem: Activity: Goal: Risk for activity intolerance will decrease Outcome: Not Progressing Note: Patient not wanting to mobilize today. I told the patient that today we do need to get up. I gave her robaxin and scheduled tylenol for any pain. I will give her Zofran when it is next available and inform her she will have one hour to rest before we ambulate. She verbalizes understanding.   Problem: Nutrition: Goal: Adequate nutrition will be maintained Outcome: Not Progressing Note: Patient refusing orals. Encouraged her to eat even if it's broth and crackers. She states she's not hungry and afraid  to eat d/t nausea. Explained that not eating can exacerbate her nausea as well. Will continue to encourage Po's.

## 2020-11-09 NOTE — Progress Notes (Signed)
Inpatient Diabetes Program Recommendations  AACE/ADA: New Consensus Statement on Inpatient Glycemic Control   Target Ranges:  Prepandial:   less than 140 mg/dL      Peak postprandial:   less than 180 mg/dL (1-2 hours)      Critically ill patients:  140 - 180 mg/dL   Review of Glycemic Control Results for Diane Lawson, Diane Lawson (MRN [id number]) as of 11/09/2020 08:48  Ref. Range 11/08/2020 07:45 11/08/2020 11:49 11/08/2020 17:00 11/08/2020 21:21 11/09/2020 08:12  Glucose-Capillary Latest Ref Range: 70 - 99 mg/dL 659 (H) 935 (H) 701 (H) 196 (H) 236 (H)    Diabetes history: DM2 Outpatient Diabetes medications: Janumet 50-1000 mg QHS, 70/30 40 units BID Current orders for Inpatient glycemic control:  Lantus 40 units Novolog 0-15 units TID with meals  Full liquid diet  Inpatient Diabetes Program Recommendations:    Fasting glucose 227, 236 this am after increase in Lantus.  -  Increase Lantus to 50 units - Add Novolog 4 units tid meal coverage if eating >50% of meals (even full liquid trays)   Thanks, Christena Deem RN, MSN, BC-ADM Inpatient Diabetes Coordinator Team Pager [phone] (8a-5p)

## 2020-11-10 LAB — GLUCOSE, CAPILLARY
Glucose-Capillary: 198 mg/dL — ABNORMAL HIGH (ref 70–99)
Glucose-Capillary: 227 mg/dL — ABNORMAL HIGH (ref 70–99)
Glucose-Capillary: 237 mg/dL — ABNORMAL HIGH (ref 70–99)
Glucose-Capillary: 224 mg/dL — ABNORMAL HIGH (ref 70–99)

## 2020-11-10 LAB — BASIC METABOLIC PANEL
Anion gap: 11 (ref 5–15)
BUN: 23 mg/dL — ABNORMAL HIGH (ref 6–20)
CO2: 24 mmol/L (ref 22–32)
Calcium: 8.5 mg/dL — ABNORMAL LOW (ref 8.9–10.3)
Chloride: 102 mmol/L (ref 98–111)
Creatinine, Ser: 0.86 mg/dL (ref 0.44–1.00)
GFR, Estimated: >60 mL/min (ref >60)
Glucose, Bld: 213 mg/dL — ABNORMAL HIGH (ref 70–99)
Potassium: 3.6 mmol/L (ref 3.5–5.1)
Sodium: 137 mmol/L (ref 135–145)

## 2020-11-10 LAB — CBC
HCT: 39.1 % (ref 36.0–46.0)
Hemoglobin: 12.5 g/dL (ref 12.0–15.0)
MCH: 28.8 pg (ref 26.0–34.0)
MCHC: 32 g/dL (ref 30.0–36.0)
MCV: 90.1 fL (ref 80.0–100.0)
Platelets: 218 10*3/uL (ref 150–400)
RBC: 4.34 MIL/uL (ref 3.87–5.11)
RDW: 15.2 % (ref 11.5–15.5)
WBC: 14 10*3/uL — ABNORMAL HIGH (ref 4.0–10.5)
nRBC: 0 % (ref 0.0–0.2)

## 2020-11-10 NOTE — Progress Notes (Signed)
Progress Note  4 Days Post-Op  Subjective: CC: feeling much better from yesterday. No nausea so far but she has not had breakfast yet. She has not ambulated yet but is motivated to do so today. No respiratory complaints   Objective: Vital signs in last 24 hours: Temp:  [97.8 F (36.6 C)-98.1 F (36.7 C)] 97.8 F (36.6 C) (06/06 0512) Pulse Rate:  [86-90] 86 (06/06 0512) Resp:  [16-18] 16 (06/06 0512) BP: (124-155)/(62-99) 148/99 (06/06 0512) SpO2:  [97 %-99 %] 99 % (06/06 0512) Last BM Date: 11/05/20  Intake/Output from previous day: 06/05 0701 - 06/06 0700 In: 2146.8 [P.O.:120; I.V.:1512.5; IV Piggyback:514.3] Out: 2638 [Urine:2050; Drains:588] Intake/Output this shift: No intake/output data recorded.  PE: General: pleasant, WD, female who is sitting up in chair in NAD HEENT: head is normocephalic, atraumatic.  Mouth is pink and moist Heart: regular, rate, and rhythm.  Normal s1,s2. No obvious murmurs, gallops, or rubs noted.  Palpable radial and pedal pulses bilaterally Lungs: CTAB, no wheezes, rhonchi, or rales noted.  Respiratory effort nonlabored Abd: soft, ND, +BS, mild expected postoperative tenderness. Incisions with surgical glue intact - no erythema or discharge. JP drain with serous output MS: all 4 extremities are symmetrical with no cyanosis. Bilateral lower extremities with pitting edema Skin: warm and dry with no masses, lesions, or rashes Psych: A&Ox3 with an appropriate affect.    Lab Results:  Recent Labs    11/09/20 0043 11/10/20 0031  WBC 14.4* 14.0*  HGB 12.3 12.5  HCT 36.7 39.1  PLT 191 218   BMET Recent Labs    11/09/20 0043 11/10/20 0031  NA 136 137  K 4.3 3.6  CL 104 102  CO2 24 24  GLUCOSE 229* 213*  BUN 24* 23*  CREATININE 0.99 0.86  CALCIUM 8.2* 8.5*   PT/INR No results for input(s): LABPROT, INR in the last 72 hours. CMP     Component Value Date/Time   NA 137 11/10/2020 0031   K 3.6 11/10/2020 0031   CL 102  11/10/2020 0031   CO2 24 11/10/2020 0031   GLUCOSE 213 (H) 11/10/2020 0031   BUN 23 (H) 11/10/2020 0031   CREATININE 0.86 11/10/2020 0031   CALCIUM 8.5 (L) 11/10/2020 0031   PROT 6.7 11/06/2020 0719   ALBUMIN 3.3 (L) 11/06/2020 0719   AST 19 11/06/2020 0719   ALT 23 11/06/2020 0719   ALKPHOS 94 11/06/2020 0719   BILITOT 2.1 (H) 11/06/2020 0719   GFRNONAA >60 11/10/2020 0031   Lipase     Component Value Date/Time   LIPASE 24 11/06/2020 0719       Studies/Results: No results found.  Anti-infectives: Anti-infectives (From admission, onward)   Start     Dose/Rate Route Frequency Ordered Stop   11/06/20 1730  metroNIDAZOLE (FLAGYL) IVPB 500 mg       "And" Linked Group Details   500 mg 100 mL/hr over 60 Minutes Intravenous Every 8 hours 11/06/20 0932     11/06/20 1630  ceFEPIme (MAXIPIME) 2 g in sodium chloride 0.9 % 100 mL IVPB       "And" Linked Group Details   2 g 200 mL/hr over 30 Minutes Intravenous Every 8 hours 11/06/20 0932     11/06/20 1245  ceFEPIme (MAXIPIME) 2 g in sodium chloride 0.9 % 100 mL IVPB        2 g 200 mL/hr over 30 Minutes Intravenous  Once 11/06/20 1240 11/06/20 1437   11/06/20 1245  metroNIDAZOLE (FLAGYL) IVPB  500 mg        500 mg 100 mL/hr over 60 Minutes Intravenous  Once 11/06/20 1241     11/06/20 0845  metroNIDAZOLE (FLAGYL) IVPB 500 mg        500 mg 100 mL/hr over 60 Minutes Intravenous  Once 11/06/20 0837 11/06/20 1028   11/06/20 0845  ceFEPIme (MAXIPIME) 2 g in sodium chloride 0.9 % 100 mL IVPB        2 g 200 mL/hr over 30 Minutes Intravenous  Once 11/06/20 0623 11/06/20 0919       Assessment/Plan  Acute perforated appendicitis POD4laparoscopic appendectomy with drain placement 6/2 Dr. Gerrit Friends - patient toleratingFLD and passing flatus no nausea today - advance to soft at lunch - ambulate today - JP 588 mL - monitor drain output, serous today. Likely discharge with it in place if output remains high - WBC 14.0 (14.4), continue to  monitor - continue IV abx  FEN - soft lunch, stop IVF VTE -lovenox  ID -cefipime/flagyl6/2>>  HTN - home meds DM -SSI, CBGs improving, appreciate assistance from diabetes coordinator, increased lantus to 50 units     LOS: 4 days    Eric Form, [facility] 11/10/2020, 10:13 AM Please see Amion for pager number during day hours 7:00am-4:30pm

## 2020-11-10 NOTE — Progress Notes (Signed)
Inpatient Diabetes Program Recommendations  AACE/ADA: New Consensus Statement on Inpatient Glycemic Control   Target Ranges:  Prepandial:   less than 140 mg/dL      Peak postprandial:   less than 180 mg/dL (1-2 hours)      Critically ill patients:  140 - 180 mg/dL  Results for Diane Lawson, Diane Lawson (MRN [id number]) as of 11/10/2020 07:46  Ref. Range 11/10/2020 00:31  Glucose Latest Ref Range: 70 - 99 mg/dL 706 (H)   Results for Diane Lawson, Diane Lawson (MRN [id number]) as of 11/10/2020 07:46  Ref. Range 11/09/2020 08:12 11/09/2020 12:00 11/09/2020 17:02 11/09/2020 21:11  Glucose-Capillary Latest Ref Range: 70 - 99 mg/dL 176 (H) 160 (H) 737 (H) 238 (H)   Review of Glycemic Control  Diabetes history: DM2 Outpatient Diabetes medications: Janumet 50-1000 mg QHS, 70/30 40 units BID Current orders for Inpatient glycemic control: Lantus 50 units QHS, Novolog 0-20 units TID with meals, Novolog 0-5 units QHS  Inpatient Diabetes Program Recommendations:    Insulin: Please consider increasing Lantus to 55 units QHS and ordering Novolog 4 units TID with meals for meal coverage if patient eats at least 50% of meals.  Thanks, Orlando Penner, RN, MSN, CDE Diabetes Coordinator Inpatient Diabetes Program [phone] (Team Pager from 8am to 5pm)

## 2020-11-11 ENCOUNTER — Inpatient Hospital Stay (HOSPITAL_COMMUNITY): Payer: Managed Care, Other (non HMO)

## 2020-11-11 LAB — GLUCOSE, CAPILLARY
Glucose-Capillary: 190 mg/dL — ABNORMAL HIGH (ref 70–99)
Glucose-Capillary: 199 mg/dL — ABNORMAL HIGH (ref 70–99)
Glucose-Capillary: 166 mg/dL — ABNORMAL HIGH (ref 70–99)
Glucose-Capillary: 171 mg/dL — ABNORMAL HIGH (ref 70–99)

## 2020-11-11 LAB — CBC
HCT: 38 % (ref 36.0–46.0)
Hemoglobin: 12.5 g/dL (ref 12.0–15.0)
MCH: 29.1 pg (ref 26.0–34.0)
MCHC: 32.9 g/dL (ref 30.0–36.0)
MCV: 88.6 fL (ref 80.0–100.0)
Platelets: 232 10*3/uL (ref 150–400)
RBC: 4.29 MIL/uL (ref 3.87–5.11)
RDW: 15.3 % (ref 11.5–15.5)
WBC: 16.2 10*3/uL — ABNORMAL HIGH (ref 4.0–10.5)
nRBC: 0 % (ref 0.0–0.2)

## 2020-11-11 MED ORDER — FUROSEMIDE 20 MG PO TABS
20.0000 mg | ORAL_TABLET | Freq: Once | ORAL | Status: AC
  Administered 2020-11-11: 20 mg via ORAL
  Filled 2020-11-11: qty 1

## 2020-11-11 MED ORDER — IOHEXOL 350 MG/ML SOLN
100.0000 mL | Freq: Once | INTRAVENOUS | Status: AC | PRN
  Administered 2020-11-11: 100 mL via INTRAVENOUS

## 2020-11-11 MED ORDER — SENNOSIDES-DOCUSATE SODIUM 8.6-50 MG PO TABS
1.0000 | ORAL_TABLET | Freq: Every day | ORAL | Status: DC
  Administered 2020-11-11 – 2020-11-12 (×2): 1 via ORAL
  Filled 2020-11-11 (×2): qty 1

## 2020-11-11 MED ORDER — IOHEXOL 9 MG/ML PO SOLN
500.0000 mL | ORAL | Status: AC
  Administered 2020-11-11 (×2): 500 mL via ORAL

## 2020-11-11 NOTE — Progress Notes (Signed)
Inpatient Diabetes Program Recommendations  AACE/ADA: New Consensus Statement on Inpatient Glycemic Control   Target Ranges:  Prepandial:   less than 140 mg/dL      Peak postprandial:   less than 180 mg/dL (1-2 hours)      Critically ill patients:  140 - 180 mg/dL  Results for Diane Lawson, Diane Lawson (MRN [id number]) as of 11/11/2020 08:17  Ref. Range 11/10/2020 10:30 11/10/2020 12:21 11/10/2020 16:46 11/10/2020 21:00 11/11/2020 07:43  Glucose-Capillary Latest Ref Range: 70 - 99 mg/dL 160 (H) 737 (H) 106 (H) 224 (H) 190 (H)    Review of Glycemic Control  Diabetes history:DM2 Outpatient Diabetes medications:Janumet 50-1000 mg QHS, 70/30 40 units BID Current orders for Inpatient glycemic control:Lantus 50 units QHS, Novolog 0-20 units TID with meals, Novolog 0-5 units QHS  Inpatient Diabetes Program Recommendations:  Insulin: Please consider increasing Lantus to 55 units QHS and ordering Novolog 4 units TID with meals for meal coverage if patient eats at least 50% of meals.  Thanks, Orlando Penner, RN, MSN, CDE Diabetes Coordinator Inpatient Diabetes Program [phone] (Team Pager from 8am to 5pm)

## 2020-11-11 NOTE — Progress Notes (Addendum)
Progress Note  5 Days Post-Op  Subjective: CC: VSS, afebrile. continues to feel better. Ambulated yesterday and tolerated soft diet without nausea or emesis. She has low appetite. Passing flatus but no BM. Has bilateral upper and lower extremity edema which she states she has occasionally at baseline - she was on a higher dose diuretic than now in the past but had some decline in kidney function so kept at a lower dose. She denies pain and respiratory complaints  Objective: Vital signs in last 24 hours: Temp:  [97.6 F (36.4 C)-98.4 F (36.9 C)] 97.6 F (36.4 C) (06/07 0427) Pulse Rate:  [73-90] 75 (06/07 0427) Resp:  [16-17] 17 (06/07 0427) BP: (136-152)/(75-86) 152/86 (06/07 0427) SpO2:  [95 %-97 %] 96 % (06/07 0427) Last BM Date: 11/05/20  Intake/Output from previous day: 06/06 0701 - 06/07 0700 In: -  Out: 1260 [Urine:1050; Drains:210] Intake/Output this shift: No intake/output data recorded.  PE: General: pleasant, WD, female who is sitting up in chair in NAD HEENT: head is normocephalic, atraumatic.  Mouth is pink and moist Heart: regular, rate, and rhythm.  Palpable radial and pedal pulses bilaterally Lungs: CTAB, no wheezes, rhonchi, or rales noted.  Respiratory effort nonlabored on room air Abd: soft, ND, +BS, mild expected postoperative tenderness. Incision with surgical glue intact - no erythema or discharge. JP drain with serous output MS: all 4 extremities are symmetrical with no cyanosis. Bilateral lower extremities with pitting edema. Bilateral upper extremities with minimal non pitting edema Skin: warm and dry with no masses, lesions, or rashes Psych: A&Ox3 with an appropriate affect.    Lab Results:  Recent Labs    11/10/20 0031 11/11/20 0140  WBC 14.0* 16.2*  HGB 12.5 12.5  HCT 39.1 38.0  PLT 218 232   BMET Recent Labs    11/09/20 0043 11/10/20 0031  NA 136 137  K 4.3 3.6  CL 104 102  CO2 24 24  GLUCOSE 229* 213*  BUN 24* 23*  CREATININE  0.99 0.86  CALCIUM 8.2* 8.5*   PT/INR No results for input(s): LABPROT, INR in the last 72 hours. CMP     Component Value Date/Time   NA 137 11/10/2020 0031   K 3.6 11/10/2020 0031   CL 102 11/10/2020 0031   CO2 24 11/10/2020 0031   GLUCOSE 213 (H) 11/10/2020 0031   BUN 23 (H) 11/10/2020 0031   CREATININE 0.86 11/10/2020 0031   CALCIUM 8.5 (L) 11/10/2020 0031   PROT 6.7 11/06/2020 0719   ALBUMIN 3.3 (L) 11/06/2020 0719   AST 19 11/06/2020 0719   ALT 23 11/06/2020 0719   ALKPHOS 94 11/06/2020 0719   BILITOT 2.1 (H) 11/06/2020 0719   GFRNONAA >60 11/10/2020 0031   Lipase     Component Value Date/Time   LIPASE 24 11/06/2020 0719       Studies/Results: No results found.  Anti-infectives: Anti-infectives (From admission, onward)   Start     Dose/Rate Route Frequency Ordered Stop   11/06/20 1730  metroNIDAZOLE (FLAGYL) IVPB 500 mg       "And" Linked Group Details   500 mg 100 mL/hr over 60 Minutes Intravenous Every 8 hours 11/06/20 0932     11/06/20 1630  ceFEPIme (MAXIPIME) 2 g in sodium chloride 0.9 % 100 mL IVPB       "And" Linked Group Details   2 g 200 mL/hr over 30 Minutes Intravenous Every 8 hours 11/06/20 0932     11/06/20 1245  ceFEPIme (MAXIPIME) 2  g in sodium chloride 0.9 % 100 mL IVPB        2 g 200 mL/hr over 30 Minutes Intravenous  Once 11/06/20 1240 11/06/20 1437   11/06/20 1245  metroNIDAZOLE (FLAGYL) IVPB 500 mg        500 mg 100 mL/hr over 60 Minutes Intravenous  Once 11/06/20 1241     11/06/20 0845  metroNIDAZOLE (FLAGYL) IVPB 500 mg        500 mg 100 mL/hr over 60 Minutes Intravenous  Once 11/06/20 0837 11/06/20 1028   11/06/20 0845  ceFEPIme (MAXIPIME) 2 g in sodium chloride 0.9 % 100 mL IVPB        2 g 200 mL/hr over 30 Minutes Intravenous  Once 11/06/20 4801 11/06/20 0919       Assessment/Plan  Acute perforated appendicitis POD5laparoscopic appendectomy with drain placement 6/2 Dr. Gerrit Friends - patient toleratingsoft diet  -  ambulating - JP 210 mL - monitor drain output, serous today. Likely discharge with it in place if output remains high - WBC 16.2 (14.0), continue to monitor - continue IV abx. CT w contrast today to evaluate further - lasix 20 mg  FEN - regular VTE -lovenox  ID -cefipime/flagyl6/2>>  HTN - home meds DM -SSI, CBGs improving, appreciate assistance from diabetes coordinator, increased lantus to 50 units    Disposition: possible discharge within next 24 hours   LOS: 5 days    Eric Form, [facility] 11/11/2020, 7:44 AM Please see Amion for pager number during day hours 7:00am-4:30pm

## 2020-11-12 ENCOUNTER — Inpatient Hospital Stay (HOSPITAL_COMMUNITY): Payer: Managed Care, Other (non HMO)

## 2020-11-12 LAB — GLUCOSE, CAPILLARY
Glucose-Capillary: 158 mg/dL — ABNORMAL HIGH (ref 70–99)
Glucose-Capillary: 156 mg/dL — ABNORMAL HIGH (ref 70–99)
Glucose-Capillary: 141 mg/dL — ABNORMAL HIGH (ref 70–99)
Glucose-Capillary: 200 mg/dL — ABNORMAL HIGH (ref 70–99)

## 2020-11-12 LAB — BASIC METABOLIC PANEL
Anion gap: 13 (ref 5–15)
BUN: 24 mg/dL — ABNORMAL HIGH (ref 6–20)
CO2: 26 mmol/L (ref 22–32)
Calcium: 8.5 mg/dL — ABNORMAL LOW (ref 8.9–10.3)
Chloride: 94 mmol/L — ABNORMAL LOW (ref 98–111)
Creatinine, Ser: 0.86 mg/dL (ref 0.44–1.00)
GFR, Estimated: >60 mL/min (ref >60)
Glucose, Bld: 172 mg/dL — ABNORMAL HIGH (ref 70–99)
Potassium: 3.3 mmol/L — ABNORMAL LOW (ref 3.5–5.1)
Sodium: 133 mmol/L — ABNORMAL LOW (ref 135–145)

## 2020-11-12 LAB — CBC
HCT: 38.5 % (ref 36.0–46.0)
Hemoglobin: 12.7 g/dL (ref 12.0–15.0)
MCH: 29 pg (ref 26.0–34.0)
MCHC: 33 g/dL (ref 30.0–36.0)
MCV: 87.9 fL (ref 80.0–100.0)
Platelets: 255 10*3/uL (ref 150–400)
RBC: 4.38 MIL/uL (ref 3.87–5.11)
RDW: 15.4 % (ref 11.5–15.5)
WBC: 17.1 10*3/uL — ABNORMAL HIGH (ref 4.0–10.5)
nRBC: 0 % (ref 0.0–0.2)

## 2020-11-12 LAB — PROTIME-INR
INR: 1.1 (ref 0.8–1.2)
Prothrombin Time: 14.5 seconds (ref 11.4–15.2)

## 2020-11-12 MED ORDER — POTASSIUM CHLORIDE CRYS ER 20 MEQ PO TBCR
20.0000 meq | EXTENDED_RELEASE_TABLET | Freq: Two times a day (BID) | ORAL | Status: AC
  Administered 2020-11-12 – 2020-11-13 (×3): 20 meq via ORAL
  Filled 2020-11-12 (×3): qty 1

## 2020-11-12 MED ORDER — OXYCODONE HCL 5 MG PO TABS
5.0000 mg | ORAL_TABLET | Freq: Four times a day (QID) | ORAL | Status: DC | PRN
Start: 2020-11-12 — End: 2020-11-13

## 2020-11-12 MED ORDER — MIDAZOLAM HCL 2 MG/2ML IJ SOLN
INTRAMUSCULAR | Status: AC
  Filled 2020-11-12: qty 4

## 2020-11-12 MED ORDER — FENTANYL CITRATE (PF) 100 MCG/2ML IJ SOLN
INTRAMUSCULAR | Status: AC
  Filled 2020-11-12: qty 4

## 2020-11-12 MED ORDER — HYDROMORPHONE HCL 1 MG/ML IJ SOLN
1.0000 mg | INTRAMUSCULAR | Status: DC | PRN
Start: 2020-11-12 — End: 2020-11-13

## 2020-11-12 MED ORDER — ENOXAPARIN SODIUM 40 MG/0.4ML IJ SOSY
40.0000 mg | PREFILLED_SYRINGE | INTRAMUSCULAR | Status: DC
  Administered 2020-11-12: 40 mg via SUBCUTANEOUS
  Filled 2020-11-12: qty 0.4

## 2020-11-12 MED ORDER — FENTANYL CITRATE (PF) 100 MCG/2ML IJ SOLN
INTRAMUSCULAR | Status: AC | PRN
  Administered 2020-11-12: 25 ug via INTRAVENOUS
  Administered 2020-11-12: 50 ug via INTRAVENOUS

## 2020-11-12 MED ORDER — MIDAZOLAM HCL 2 MG/2ML IJ SOLN
INTRAMUSCULAR | Status: AC | PRN
  Administered 2020-11-12: 0.5 mg via INTRAVENOUS
  Administered 2020-11-12: 1 mg via INTRAVENOUS

## 2020-11-12 MED ORDER — OXYCODONE HCL 5 MG PO TABS: 5.0000 mg | ORAL_TABLET | Freq: Four times a day (QID) | ORAL | Status: DC | PRN

## 2020-11-12 MED ORDER — ACETAMINOPHEN 500 MG PO TABS: 1000.0000 mg | ORAL_TABLET | Freq: Four times a day (QID) | ORAL | Status: DC | PRN

## 2020-11-12 NOTE — Progress Notes (Signed)
Progress Note  6 Days Post-Op  Subjective: CC: VSS, afebrile. Feeling better each day. Did not walk much yesterday due to feeling poorly from the oral contrast. No BM. Pain well controlled  Objective: Vital signs in last 24 hours: Temp:  [97.8 F (36.6 C)-98.6 F (37 C)] 97.8 F (36.6 C) (06/08 0608) Pulse Rate:  [76-82] 76 (06/08 0608) Resp:  [16-17] 16 (06/08 0608) BP: (148-157)/(72-77) 148/72 (06/08 0608) SpO2:  [97 %-99 %] 97 % (06/08 0608) Last BM Date: 11/05/20  Intake/Output from previous day: 06/07 0701 - 06/08 0700 In: 180 [P.O.:180] Out: 1555 [Urine:1450; Drains:105] Intake/Output this shift: No intake/output data recorded.  PE: General: pleasant, WD, female who is sitting up in chair in NAD HEENT: head is normocephalic, atraumatic.  Mouth is pink and moist Heart: regular, rate, and rhythm.  Palpable radial and pedal pulses bilaterally Lungs: Respiratory effort nonlabored on room air Abd: soft, ND, mild expected postoperative tenderness. JP drain with serous output MS: all 4 extremities are symmetrical with no cyanosis. Bilateral lower extremities with pitting edema - improved from yesterday Skin: warm and dry with no masses, lesions, or rashes Psych: A&Ox3 with an appropriate affect.    Lab Results:  Recent Labs    11/11/20 0140 11/12/20 0219  WBC 16.2* 17.1*  HGB 12.5 12.7  HCT 38.0 38.5  PLT 232 255   BMET Recent Labs    11/10/20 0031 11/12/20 0219  NA 137 133*  K 3.6 3.3*  CL 102 94*  CO2 24 26  GLUCOSE 213* 172*  BUN 23* 24*  CREATININE 0.86 0.86  CALCIUM 8.5* 8.5*   PT/INR No results for input(s): LABPROT, INR in the last 72 hours. CMP     Component Value Date/Time   NA 133 (L) 11/12/2020 0219   K 3.3 (L) 11/12/2020 0219   CL 94 (L) 11/12/2020 0219   CO2 26 11/12/2020 0219   GLUCOSE 172 (H) 11/12/2020 0219   BUN 24 (H) 11/12/2020 0219   CREATININE 0.86 11/12/2020 0219   CALCIUM 8.5 (L) 11/12/2020 0219   PROT 6.7 11/06/2020  0719   ALBUMIN 3.3 (L) 11/06/2020 0719   AST 19 11/06/2020 0719   ALT 23 11/06/2020 0719   ALKPHOS 94 11/06/2020 0719   BILITOT 2.1 (H) 11/06/2020 0719   GFRNONAA >60 11/12/2020 0219   Lipase     Component Value Date/Time   LIPASE 24 11/06/2020 0719       Studies/Results: CT ABDOMEN PELVIS W CONTRAST  Result Date: 11/11/2020 CLINICAL DATA:  Evaluate intra-abdominal abscess. Status post laparoscopic appendectomy for perforated appendicitis EXAM: CT ABDOMEN AND PELVIS WITH CONTRAST TECHNIQUE: Multidetector CT imaging of the abdomen and pelvis was performed using the standard protocol following bolus administration of intravenous contrast. CONTRAST:  OMNIPAQUE IOHEXOL 350 MG/ML SOLN COMPARISON:  11/06/2020 FINDINGS: Lower chest: New subsegmental atelectasis identified within the lung bases. Hepatobiliary: No focal liver abnormality is seen. No gallstones, gallbladder wall thickening, or biliary dilatation. Pancreas: Unremarkable. No pancreatic ductal dilatation or surrounding inflammatory changes. Spleen: Normal in size without focal abnormality. Adrenals/Urinary Tract: Normal adrenal glands. No kidney stones, mass or hydronephrosis. Urinary bladder is unremarkable. Stomach/Bowel: Stomach appears nondistended. There is mild diffuse gaseous distension of the colon compatible with postoperative ileus. No significant small bowel dilatation identified. Pericecal fluid collection is identified in the right lower quadrant this measures 8.0 x 3.6 x 1.7 cm (volume = 26 cm^3). Vascular/Lymphatic: Aortic atherosclerosis. No aneurysm. No abdominopelvic adenopathy. Reproductive: Uterus and bilateral adnexa are  unremarkable. Other: Soft tissue stranding is identified within the small bowel mesentery. The appearance is similar to previous exam. Within the right lower quadrant ileocolic mesentery there is a small fluid collection which measures 2.3 x 1.6 by 2.3 cm (volume = 4.4 cm^3), image 63/2. This appears  to communicate with the larger pericecal fluid collection via a small fistulous tract, image 82/6. Diffuse subcutaneous soft tissue stranding noted within the patient's pannus. Musculoskeletal: Mild lumbar degenerative disc disease. IMPRESSION: 1. Status post appendectomy. There is a small pericecal fluid collection which measures approximately 26 cc, without thickened enhancing rind. This appears to communicate with a smaller ileocolic mesenteric fluid collection via a short fistulous tract. 2. Postoperative ileus. 3. New subsegmental atelectasis within the lung bases. 4. Aortic atherosclerosis. Aortic Atherosclerosis (ICD10-I70.0). Electronically Signed   By: Signa Kell M.D.   On: 11/11/2020 14:06    Anti-infectives: Anti-infectives (From admission, onward)   Start     Dose/Rate Route Frequency Ordered Stop   11/06/20 1730  metroNIDAZOLE (FLAGYL) IVPB 500 mg       "And" Linked Group Details   500 mg 100 mL/hr over 60 Minutes Intravenous Every 8 hours 11/06/20 0932     11/06/20 1630  ceFEPIme (MAXIPIME) 2 g in sodium chloride 0.9 % 100 mL IVPB       "And" Linked Group Details   2 g 200 mL/hr over 30 Minutes Intravenous Every 8 hours 11/06/20 0932     11/06/20 1245  ceFEPIme (MAXIPIME) 2 g in sodium chloride 0.9 % 100 mL IVPB        2 g 200 mL/hr over 30 Minutes Intravenous  Once 11/06/20 1240 11/06/20 1437   11/06/20 1245  metroNIDAZOLE (FLAGYL) IVPB 500 mg        500 mg 100 mL/hr over 60 Minutes Intravenous  Once 11/06/20 1241     11/06/20 0845  metroNIDAZOLE (FLAGYL) IVPB 500 mg        500 mg 100 mL/hr over 60 Minutes Intravenous  Once 11/06/20 0837 11/06/20 1028   11/06/20 0845  ceFEPIme (MAXIPIME) 2 g in sodium chloride 0.9 % 100 mL IVPB        2 g 200 mL/hr over 30 Minutes Intravenous  Once 11/06/20 4696 11/06/20 0919       Assessment/Plan  Acute perforated appendicitis POD6laparoscopic appendectomy with drain placement 6/2 Dr. Gerrit Lawson - patient toleratingregular diet. Now  NPO - ambulating - JP 105 mL - monitor drain output, serous today. Likely discharge with it in place if output remains high - WBC 17.1 (16.2) continue to monitor - continue IV abx.  - CT w contrast 6/8: small pericecal fluid collection which measures approximately 26 cc, without thickened enhancing rind - possible communication with a smaller ileocolic mesenteric fluid collection via a short fistulous tract - IR eval today for possible drain placement  FEN - NPO - back to regular pending IR eval VTE -lovenox held today pending IR eval ID -[email or web address]/[email or web address]>>  HTN - home meds DM -SSI, CBGs improving, appreciate assistance from diabetes coordinator, increased lantus to 50 units - if diet resumed today and patient with good intake - increase Lantus to 52 units QHS and add Novolog 4 units TID with meals for meal coverage    LOS: 6 days    Eric Form, [facility] 11/12/2020, 8:53 AM Please see Amion for pager number during day hours 7:00am-4:30pm

## 2020-11-12 NOTE — Procedures (Signed)
Interventional Radiology Procedure Note  Procedure: CT guided abdominal aspiration  Findings: Please refer to procedural dictation for full description. RLQ fluid collection targeted with 18 ga trocar needle.  Scant, serous fluid aspirated, approximately 3 mL, sent for culture.  No purulence.  No drain placed.  Complications: None immediate  Estimated Blood Loss: < 5 mL  Recommendations: Follow up fluid culture.   Marliss Coots, MD Pager: [phone]

## 2020-11-12 NOTE — Consult Note (Signed)
Chief Complaint: Patient was seen in consultation today for pelvic collection aspiration vs drain placement Chief Complaint  Patient presents with  . Abdominal Pain    Pt come via [facility] with c/c of abd pain. Pt is from home with RLQ pain. Per EMS pt has had pain for 2x days with bloating, n/v. Pt is DM hx (BS 322).   90HR, 96%RA    at the request of Dr Gerrit Friends  Supervising Physician: Marliss Coots  Patient Status: [facility] - In-pt  History of Present Illness: Diane Lawson is a 60 y.o. female   Perforated appendicitis Lap appendectomy with drain placement 6/2- Dr Gerrit Friends  Wbc count high  CT 6/7: IMPRESSION: 1. Status post appendectomy. There is a small pericecal fluid collection which measures approximately 26 cc, without thickened enhancing rind. This appears to communicate with a smaller ileocolic mesenteric fluid collection via a short fistulous tract. 2. Postoperative ileus. 3. New subsegmental atelectasis within the lung bases. 4. Aortic atherosclerosis.  CCS requesting aspiration vs drain placement Dr Elby Showers has reviewed imaging--- approves procedure  Scheduled now for same  Past Medical History:  Diagnosis Date  . Diabetes mellitus without complication (HCC)   . Hypertension     Past Surgical History:  Procedure Laterality Date  . BACK SURGERY  2007  . CESAREAN SECTION    . FRACTURE SURGERY     ankle (1999)  . LAPAROSCOPIC APPENDECTOMY N/A 11/06/2020   Procedure: APPENDECTOMY LAPAROSCOPIC;  Surgeon: Darnell Level, MD;  Location: [facility] OR;  Service: General;  Laterality: N/A;    Allergies: Penicillins  Medications: Prior to Admission medications   Medication Sig Start Date End Date Taking? Authorizing Provider  atorvastatin (LIPITOR) 80 MG tablet Take 80 mg by mouth daily.   Yes [provider]  buPROPion (WELLBUTRIN XL) 300 MG 24 hr tablet Take 300 mg by mouth daily. 10/28/20  Yes [provider]  Chlorphen-Phenyleph-ASA  (ALKA-SELTZER PLUS COLD) 2-7.8-325 MG TBEF Take 1 tablet by mouth every 6 (six) hours as needed (for symptoms- dissolve in water before drinking).   Yes [provider]  Cholecalciferol (VITAMIN D3) 125 MCG (5000 UT) CAPS Take 5,000 Units by mouth daily.   Yes [provider]  hydrochlorothiazide (HYDRODIURIL) 12.5 MG tablet Take 12.5 mg by mouth every morning. 08/11/20  Yes [provider]  Multiple Vitamins-Minerals (HAIR/SKIN/NAILS/BIOTIN) TABS Take 3 tablets by mouth daily.   Yes [provider]  Multiple Vitamins-Minerals (MULTI FOR HER 50+) TABS Take 1 tablet by mouth daily.   Yes [provider]  Multiple Vitamins-Minerals (MULTIVITAMIN GUMMIES ADULT) CHEW Chew 1 tablet by mouth daily.   Yes [provider]  NOVOLIN 70/30 RELION (70-30) 100 UNIT/ML injection Inject 40 Units into the skin in the morning and at bedtime.   Yes [provider]  Potassium 99 MG TABS Take 99 mg by mouth daily.   Yes [provider]  Insulin NPH Isophane & Regular (HUMULIN 70/30 [place]) Inject 40 Units into the skin in the morning and at bedtime. Patient not taking: Reported on 11/06/2020    [provider]  Multiple Vitamins-Minerals (HAIR SKIN & NAILS ADVANCED) TABS Take 1 tablet by mouth daily. Patient not taking: No sig reported    [provider]  sitaGLIPtin-metformin (JANUMET) 50-1000 MG per tablet Take 1 tablet by mouth 1 day or 1 dose. Take One HS Patient not taking: Reported on 11/06/2020 11/05/13   Carmelina Dane, MD  Vitamin D, Ergocalciferol, 50000 units CAPS Take  5,000 Units by mouth daily. Patient not taking: No sig reported    [provider]     Family History  Adopted: Yes    Social History   Socioeconomic History  . Marital status: Widowed    Spouse name: Not on file  . Number of children: Not on file  . Years of education: Not on file  . Highest education level: Not on file  Occupational  History  . Not on file  Tobacco Use  . Smoking status: Never Smoker  . Smokeless tobacco: Not on file  Substance and Sexual Activity  . Alcohol use: No  . Drug use: No  . Sexual activity: Not on file  Other Topics Concern  . Not on file  Social History Narrative  . Not on file   Social Determinants of Health   Financial Resource Strain: Not on file  Food Insecurity: Not on file  Transportation Needs: Not on file  Physical Activity: Not on file  Stress: Not on file  Social Connections: Not on file    Review of Systems: A 12 point ROS discussed and pertinent positives are indicated in the HPI above.  All other systems are negative.  Review of Systems  Constitutional: Positive for activity change, appetite change and fatigue. Negative for fever.  Respiratory: Negative for cough and shortness of breath.   Cardiovascular: Negative for chest pain.  Gastrointestinal: Positive for abdominal pain and nausea.  Psychiatric/Behavioral: Negative for behavioral problems and confusion.    Vital Signs: BP (!) 148/72 (BP Location: Right Arm)   Pulse 76   Temp 97.8 F (36.6 C) (Oral)   Resp 16   Ht 5\' 7"  (1.702 m)   Wt (!) 320 lb (145.2 kg)   LMP 10/24/2013   SpO2 97%   BMI 50.12 kg/m   Physical Exam Vitals reviewed.  Cardiovascular:     Rate and Rhythm: Normal rate and regular rhythm.     Heart sounds: Normal heart sounds.  Pulmonary:     Effort: Pulmonary effort is normal.     Breath sounds: No wheezing.  Abdominal:     Tenderness: There is abdominal tenderness.  Skin:    General: Skin is warm.  Neurological:     Mental Status: She is alert and oriented to person, place, and time.  Psychiatric:        Behavior: Behavior normal.     Imaging: CT Abdomen Pelvis Wo Contrast  Result Date: 11/06/2020 CLINICAL DATA:  Epigastric pain radiating to the right lower quadrant with nausea and vomiting. Fever. Constipation. EXAM: CT ABDOMEN AND PELVIS WITHOUT CONTRAST TECHNIQUE:  Multidetector CT imaging of the abdomen and pelvis was performed following the standard protocol without IV contrast. COMPARISON:  None. FINDINGS: Lower chest: Clear lung bases.  Normal heart size. Hepatobiliary: No focal liver abnormality is seen. No gallstones, gallbladder wall thickening, or biliary dilatation. Pancreas: Unremarkable. Spleen: Unremarkable. Adrenals/Urinary Tract: Unremarkable adrenal glands. Mildly lobular contour and mild cortical thinning involving both kidneys. No renal calculi or hydronephrosis. Unremarkable bladder. Stomach/Bowel: The stomach is unremarkable. There is no evidence of bowel obstruction. The appendix is dilated and thick walled measuring 1.5 cm in diameter near its base with an associated 5 mm appendicolith. There is moderate inflammation in the surrounding fat, and there are multiple small foci of extraluminal gas in this region. Vascular/Lymphatic: Abdominal aortic atherosclerosis without aneurysm. No enlarged lymph nodes. Reproductive: Uterus and bilateral adnexa are unremarkable. Other: Small volume intraperitoneal free fluid in the right lower  quadrant. No drainable fluid collection. Musculoskeletal: No acute osseous abnormality or suspicious osseous lesion. Lumbar disc degeneration greatest at L4-5 with a disc osteophyte complex resulting in asymmetric left lateral recess stenosis. 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 IMPRESSION: 1. Perforated acute appendicitis.  No drainable fluid collection. 2. Aortic Atherosclerosis (ICD10-I70.0). These results were called by telephone at the time of interpretation on 11/06/2020 at 8:34 am to Dr. Rolan Bucco, who verbally acknowledged these results. Electronically Signed   By: Sebastian Ache M.D.   On: 11/06/2020 08:36   CT ABDOMEN PELVIS W CONTRAST  Result Date: 11/11/2020 CLINICAL DATA:  Evaluate intra-abdominal abscess. Status post laparoscopic appendectomy for perforated appendicitis EXAM: CT ABDOMEN AND  PELVIS WITH CONTRAST TECHNIQUE: Multidetector CT imaging of the abdomen and pelvis was performed using the standard protocol following bolus administration of intravenous contrast. CONTRAST:  OMNIPAQUE IOHEXOL 350 MG/ML SOLN COMPARISON:  11/06/2020 FINDINGS: Lower chest: New subsegmental atelectasis identified within the lung bases. Hepatobiliary: No focal liver abnormality is seen. No gallstones, gallbladder wall thickening, or biliary dilatation. Pancreas: Unremarkable. No pancreatic ductal dilatation or surrounding inflammatory changes. Spleen: Normal in size without focal abnormality. Adrenals/Urinary Tract: Normal adrenal glands. No kidney stones, mass or hydronephrosis. Urinary bladder is unremarkable. Stomach/Bowel: Stomach appears nondistended. There is mild diffuse gaseous distension of the colon compatible with postoperative ileus. No significant small bowel dilatation identified. Pericecal fluid collection is identified in the right lower quadrant this measures 8.0 x 3.6 x 1.7 cm (volume = 26 cm^3). Vascular/Lymphatic: Aortic atherosclerosis. No aneurysm. No abdominopelvic adenopathy. Reproductive: Uterus and bilateral adnexa are unremarkable. Other: Soft tissue stranding is identified within the small bowel mesentery. The appearance is similar to previous exam. Within the right lower quadrant ileocolic mesentery there is a small fluid collection which measures 2.3 x 1.6 by 2.3 cm (volume = 4.4 cm^3), image 63/2. This appears to communicate with the larger pericecal fluid collection via a small fistulous tract, image 82/6. Diffuse subcutaneous soft tissue stranding noted within the patient's pannus. Musculoskeletal: Mild lumbar degenerative disc disease. IMPRESSION: 1. Status post appendectomy. There is a small pericecal fluid collection which measures approximately 26 cc, without thickened enhancing rind. This appears to communicate with a smaller ileocolic mesenteric fluid collection via a short  fistulous tract. 2. Postoperative ileus. 3. New subsegmental atelectasis within the lung bases. 4. Aortic atherosclerosis. Aortic Atherosclerosis (ICD10-I70.0). Electronically Signed   By: Signa Kell M.D.   On: 11/11/2020 14:06   DG Abd Portable 1V  Result Date: 11/08/2020 CLINICAL DATA:  Abdominal pain with nausea and vomiting. Recent appendectomy EXAM: PORTABLE ABDOMEN - 1 VIEW COMPARISON:  CT abdomen and pelvis November 06, 2020 FINDINGS: There is moderate air in the transverse colon. There is no bowel dilatation or air-fluid level to indicate bowel obstruction. No free air evident on supine examination. Lung bases clear. No abnormal calcifications. IMPRESSION: No bowel obstruction or free air evident on supine examination. Lung bases clear. Electronically Signed   By: Bretta Bang III M.D.   On: 11/08/2020 10:26    Labs:  CBC: Recent Labs    11/09/20 0043 11/10/20 0031 11/11/20 0140 11/12/20 0219  WBC 14.4* 14.0* 16.2* 17.1*  HGB 12.3 12.5 12.5 12.7  HCT 36.7 39.1 38.0 38.5  PLT 191 218 232 255    COAGS: No results for input(s): INR, APTT in the last 8760 hours.  BMP: Recent Labs    11/08/20 0138 11/09/20 0043 11/10/20 0031 11/12/20 0219  NA 135 136 137 133*  K 4.0 4.3 3.6 3.3*  CL 101 104 102 94*  CO2 24 24 24 26   GLUCOSE 233* 229* 213* 172*  BUN 22* 24* 23* 24*  CALCIUM 8.2* 8.2* 8.5* 8.5*  CREATININE 1.22* 0.99 0.86 0.86  GFRNONAA 51* >60 >60 >60    LIVER FUNCTION TESTS: Recent Labs    11/06/20 0719  BILITOT 2.1*  AST 19  ALT 23  ALKPHOS 94  PROT 6.7  ALBUMIN 3.3*    TUMOR MARKERS: No results for input(s): AFPTM, CEA, CA199, CHROMGRNA in the last 8760 hours.  Assessment and Plan:  Perforated appendicitis Lap appendectomy and drain 6/2 Now with continued abd pain and rising wbc CT revealing small pelvic fluid collection Scheduled now for aspiration or drain of same Risks and benefits discussed with the patient including bleeding, infection,  damage to adjacent structures, bowel perforation/fistula connection, and sepsis.  All of the patient's questions were answered, patient is agreeable to proceed. Consent signed and in chart.   Thank you for this interesting consult.  I greatly enjoyed meeting Diane Lawson and look forward to participating in their care.  A copy of this report was sent to the requesting provider on this date.  Electronically Signed: Robet Leu, PA-C 11/12/2020, 9:47 AM   I spent a total of 20 Minutes    in face to face in clinical consultation, greater than 50% of which was counseling/coordinating care for pelvic fluid collection aspiration or drain

## 2020-11-12 NOTE — Progress Notes (Signed)
Inpatient Diabetes Program Recommendations  AACE/ADA: New Consensus Statement on Inpatient Glycemic Control   Target Ranges:  Prepandial:   less than 140 mg/dL      Peak postprandial:   less than 180 mg/dL (1-2 hours)      Critically ill patients:  140 - 180 mg/dL  Results for Diane Lawson, Diane Lawson (MRN [id number]) as of 11/12/2020 07:27  Ref. Range 11/12/2020 02:19  Glucose Latest Ref Range: 70 - 99 mg/dL 537 (H)   Results for Diane Lawson, Diane Lawson (MRN [id number]) as of 11/12/2020 07:27  Ref. Range 11/11/2020 07:43 11/11/2020 11:26 11/11/2020 16:57 11/11/2020 21:03  Glucose-Capillary Latest Ref Range: 70 - 99 mg/dL 544 (H)  Novolog 4 units 199 (H)  Novolog 4 units 166 (H)  Novolog 4 units 171 (H)    Lantus 50 units   Review of Glycemic Control  Diabetes history:DM2 Outpatient Diabetes medications:Janumet 50-1000 mg QHS, 70/30 40 units BID Current orders for Inpatient glycemic control:Lantus 50 units QHS,Novolog 0-20units TID with meals, Novolog 0-5 units QHS  Inpatient Diabetes Program Recommendations:  Insulin: Please considerincreasing Lantus to 52 units QHS and ordering Novolog 4 units TID with meals for meal coverage if patient eats at least 50% of meals.  Thanks, Orlando Penner, RN, MSN, CDE Diabetes Coordinator Inpatient Diabetes Program [phone] (Team Pager from 8am to 5pm)

## 2020-11-13 LAB — BASIC METABOLIC PANEL
Anion gap: 7 (ref 5–15)
BUN: 20 mg/dL (ref 6–20)
CO2: 28 mmol/L (ref 22–32)
Calcium: 8.4 mg/dL — ABNORMAL LOW (ref 8.9–10.3)
Chloride: 101 mmol/L (ref 98–111)
Creatinine, Ser: 0.89 mg/dL (ref 0.44–1.00)
GFR, Estimated: >60 mL/min (ref >60)
Glucose, Bld: 170 mg/dL — ABNORMAL HIGH (ref 70–99)
Potassium: 3.2 mmol/L — ABNORMAL LOW (ref 3.5–5.1)
Sodium: 136 mmol/L (ref 135–145)

## 2020-11-13 LAB — CBC
HCT: 37.7 % (ref 36.0–46.0)
Hemoglobin: 12 g/dL (ref 12.0–15.0)
MCH: 28.2 pg (ref 26.0–34.0)
MCHC: 31.8 g/dL (ref 30.0–36.0)
MCV: 88.7 fL (ref 80.0–100.0)
Platelets: 251 10*3/uL (ref 150–400)
RBC: 4.25 MIL/uL (ref 3.87–5.11)
RDW: 15.2 % (ref 11.5–15.5)
WBC: 15 10*3/uL — ABNORMAL HIGH (ref 4.0–10.5)
nRBC: 0 % (ref 0.0–0.2)

## 2020-11-13 LAB — GLUCOSE, CAPILLARY
Glucose-Capillary: 135 mg/dL — ABNORMAL HIGH (ref 70–99)
Glucose-Capillary: 170 mg/dL — ABNORMAL HIGH (ref 70–99)

## 2020-11-13 MED ORDER — METRONIDAZOLE 500 MG PO TABS: 500.0000 mg | ORAL_TABLET | Freq: Three times a day (TID) | ORAL | 0 refills | Status: AC

## 2020-11-13 MED ORDER — CIPROFLOXACIN HCL 500 MG PO TABS: 500.0000 mg | ORAL_TABLET | Freq: Two times a day (BID) | ORAL | 0 refills | Status: AC

## 2020-11-13 MED ORDER — ACETAMINOPHEN 500 MG PO TABS: 1000.0000 mg | ORAL_TABLET | Freq: Four times a day (QID) | ORAL | 0 refills | Status: DC | PRN

## 2020-11-13 NOTE — Discharge Summary (Signed)
[facility] Discharge Summary   Patient ID: Diane Lawson MRN: [id number] DOB/AGE: [birth year] 58 y.o.  Admit date: 11/06/2020 Discharge date: 11/13/2020  Admitting Diagnosis: Acute perforated appendicitis  Discharge Diagnosis Patient Active Problem List   Diagnosis Date Noted   Acute perforated appendicitis 11/06/2020    Consultants None   Imaging: CT ABDOMEN PELVIS W CONTRAST  Result Date: 11/11/2020 CLINICAL DATA:  Evaluate intra-abdominal abscess. Status post laparoscopic appendectomy for perforated appendicitis EXAM: CT ABDOMEN AND PELVIS WITH CONTRAST TECHNIQUE: Multidetector CT imaging of the abdomen and pelvis was performed using the standard protocol following bolus administration of intravenous contrast. CONTRAST:  OMNIPAQUE IOHEXOL 350 MG/ML SOLN COMPARISON:  11/06/2020 FINDINGS: Lower chest: New subsegmental atelectasis identified within the lung bases. Hepatobiliary: No focal liver abnormality is seen. No gallstones, gallbladder wall thickening, or biliary dilatation. Pancreas: Unremarkable. No pancreatic ductal dilatation or surrounding inflammatory changes. Spleen: Normal in size without focal abnormality. Adrenals/Urinary Tract: Normal adrenal glands. No kidney stones, mass or hydronephrosis. Urinary bladder is unremarkable. Stomach/Bowel: Stomach appears nondistended. There is mild diffuse gaseous distension of the colon compatible with postoperative ileus. No significant small bowel dilatation identified. Pericecal fluid collection is identified in the right lower quadrant this measures 8.0 x 3.6 x 1.7 cm (volume = 26 cm^3). Vascular/Lymphatic: Aortic atherosclerosis. No aneurysm. No abdominopelvic adenopathy. Reproductive: Uterus and bilateral adnexa are unremarkable. Other: Soft tissue stranding is identified within the small bowel mesentery. The appearance is similar to previous exam. Within the right lower quadrant ileocolic mesentery there is a small  fluid collection which measures 2.3 x 1.6 by 2.3 cm (volume = 4.4 cm^3), image 63/2. This appears to communicate with the larger pericecal fluid collection via a small fistulous tract, image 82/6. Diffuse subcutaneous soft tissue stranding noted within the patient's pannus. Musculoskeletal: Mild lumbar degenerative disc disease. IMPRESSION: 1. Status post appendectomy. There is a small pericecal fluid collection which measures approximately 26 cc, without thickened enhancing rind. This appears to communicate with a smaller ileocolic mesenteric fluid collection via a short fistulous tract. 2. Postoperative ileus. 3. New subsegmental atelectasis within the lung bases. 4. Aortic atherosclerosis. Aortic Atherosclerosis (ICD10-I70.0). Electronically Signed   By: Signa Kell M.D.   On: 11/11/2020 14:06   CT ASPIRATION  Result Date: 11/12/2020 INDICATION: 60 year old female with history of acute perforated appendicitis status post laparoscopic appendectomy with persistent leukocytosis and fluid collection in the right lower quadrant on recent CT abdomen pelvis. EXAM: CT-guided abdominal fluid collection aspiration MEDICATIONS: The patient is currently admitted to the hospital and receiving intravenous antibiotics. The antibiotics were administered within an appropriate time frame prior to the initiation of the procedure. ANESTHESIA/SEDATION: Fentanyl 75 mcg IV; Versed 1.5 mg IV Moderate Sedation Time:  10 The patient was continuously monitored during the procedure by the interventional radiology nurse under my direct supervision. COMPLICATIONS: None immediate. PROCEDURE: Informed written consent was obtained from the patient after a thorough discussion of the procedural risks, benefits and alternatives. All questions were addressed. Maximal Sterile Barrier Technique was utilized including caps, mask, sterile gowns, sterile gloves, sterile drape, hand hygiene and skin antiseptic. A timeout was performed prior to the  initiation of the procedure. The right lower quadrant prepped and draped in standard fashion. A similar appearing ill-defined fluid collection measuring approximately 8.1 x 3.2 cm in axial dimension was identified on preprocedure CT in the right lower quadrant. Procedure was planned. Subdermal Local anesthesia was provided with 2 lidocaine. A small skin nick was made. Under intermittent CT guidance,  an 18 gauge trocar needle was directed into the central aspect of the fluid collection. Aspiration was performed which yielded scant serous fluid. The needle was removed. Sample was sent for culture. The patient tolerated the procedure well without complication. IMPRESSION: Technically successful CT-guided right lower quadrant fluid collection aspiration which yielded scant, serous fluid. Marliss Coots, MD [facility] Radiology Electronically Signed   By: Marliss Coots MD   On: 11/12/2020 16:06    Procedures Dr. Darnell Level (11/06/2020) - Laparoscopic Appendectomy  Hospital Course:  61 y/o F who presented to [facility] with abdominal pain.  Workup showed acute appendicitis with perforation, without abscess.  Patient was admitted and underwent procedure listed above.  Tolerated procedure well and was transferred to the floor.  Diet was advanced as tolerated.  Post-operatively the patient did have a rising leukocytosis and CT scan was performed to rule out abscess, CT showed pericecal fluid collection that was aspirated by IR. On 11/13/20 the patient was voiding well, tolerating diet, ambulating well, pain well controlled, vital signs stable, incisions c/d/i and felt stable for discharge home.  Patient will follow up in our office as below and knows to call with questions or concerns. She was discharged home on 5 days of cipro/flagyl (PCN allergy) and her surgical drain was removed prior to discharge.   Physical Exam: General:  Alert, NAD, pleasant, comfortable Abd:  Soft,  obese, ND, NT, incisions C/D/I, RLQ drain with minimal serous drainage (drain to be removed by RN before D/C).   Allergies as of 11/13/2020       Reactions   Penicillins Shortness Of Breath, Rash, Other (See Comments)   Childhood allergy        Medication List     TAKE these medications    acetaminophen 500 MG tablet Commonly known as: TYLENOL Take 2 tablets (1,000 mg total) by mouth every 6 (six) hours as needed for mild pain, fever or moderate pain.   Alka-Seltzer Plus Cold 2-7.8-325 MG Tbef Generic drug: Chlorphen-Phenyleph-ASA Take 1 tablet by mouth every 6 (six) hours as needed (for symptoms- dissolve in water before drinking).   atorvastatin 80 MG tablet Commonly known as: LIPITOR Take 80 mg by mouth daily.   buPROPion 300 MG 24 hr tablet Commonly known as: WELLBUTRIN XL Take 300 mg by mouth daily.   ciprofloxacin 500 MG tablet Commonly known as: Cipro Take 1 tablet (500 mg total) by mouth 2 (two) times daily for 5 days.   HUMULIN 70/30  Inject 40 Units into the skin in the morning and at bedtime.   NovoLIN 70/30 ReliOn (70-30) 100 UNIT/ML injection Generic drug: insulin NPH-regular Human Inject 40 Units into the skin in the morning and at bedtime.   hydrochlorothiazide 12.5 MG tablet Commonly known as: HYDRODIURIL Take 12.5 mg by mouth every morning.   metroNIDAZOLE 500 MG tablet Commonly known as: Flagyl Take 1 tablet (500 mg total) by mouth 3 (three) times daily for 5 days.   Multi For Her 50+ Tabs Take 1 tablet by mouth daily.   Hair/Skin/Nails/Biotin Tabs Take 3 tablets by mouth daily.   Multivitamin Gummies Adult Chew Chew 1 tablet by mouth daily.   Potassium 99 MG Tabs Take 99 mg by mouth daily.   Vitamin D3 125 MCG (5000 UT) Caps Take 5,000 Units by mouth daily.       ASK your doctor about these medications    Hair Skin & Nails Advanced Tabs Take 1 tablet by mouth daily.  sitaGLIPtin-metformin 50-1000 MG tablet Commonly known  as: Janumet Take 1 tablet by mouth 1 day or 1 dose. Take One HS   Vitamin D (Ergocalciferol) 50000 units Caps Take 5,000 Units by mouth daily.          Follow-up Information     [facility] Follow up on 12/02/2020.   Specialty: General Surgery Why: 8:45am, Please arrive 30 minutes prior to your appointment time for paperwork and check in process Contact information: [address] [place] [address] [phone]                 Signed: Hosie Spangle, [facility] 11/13/2020, 10:13 AM

## 2020-11-13 NOTE — Progress Notes (Signed)
Haruna Collier-Bullis to be D/C'd per MD order. Discussed with the patient and all questions fully answered. ? VSS, Skin clean, dry and intact without evidence of skin break down, no evidence of skin tears noted. J{ drain discontinued intact. Patient tolerated removal well. No bleeding from site. Pressure dressing with guaze and tegaderm applied. ? IV catheters discontinued intact. Sites without signs and symptoms of complications. Dressings and pressure applied. ? An After Visit Summary was printed and given to the patient. Patient informed where to pickup prescriptions. ? D/c education completed with patient/family including follow up instructions, medication list, d/c activities limitations if indicated, with other d/c instructions as indicated by MD - patient able to verbalize understanding, all questions fully answered.  ? Patient instructed to return to ED, call 911, or call MD for any changes in condition.  ? Patient to be escorted via WC, and D/C home via private auto.

## 2020-11-13 NOTE — Progress Notes (Signed)
60 yo female s/p aspiration of a fluid collection in the right lower quadrant with Dr. Elby Showers on 11/12/20.   Patient states that she is feeling much better today and is getting discharged.  RLQ puncture site checked at the bedside.  Positive dressing on RLQ puncture site.  Site is unremarkable with no erythema, edema, tenderness, bleeding or drainage. Minimal amount of old, dry blood note son the dressing.  Dressing otherwise clean, dry, and intact.  Dressing removed, no further dressing change needed.   Please call IR for questions and concerns regarding RLQ fluid collection aspiration.     Diane Lawson H Diane Diffee PA-C 11/13/2020 1:15 PM

## 2020-11-17 LAB — AEROBIC/ANAEROBIC CULTURE W GRAM STAIN (SURGICAL/DEEP WOUND)
Culture: NO GROWTH
Gram Stain: NONE SEEN

## 2021-09-15 IMAGING — CT CT GUIDANCE NEEDLE PLACEMENT
1 of 2 series · 15 of 32 positions shown, 19 images · non-contrast
Comparison: none

INDICATION: 60-year-old female with history of acute perforated appendicitis
status post laparoscopic appendectomy with persistent leukocytosis
and fluid collection in the right lower quadrant on recent CT
abdomen pelvis.

[Series 2: i-spiral 5.0 b40f · axial · 0.98mm/px · z∈[+809,+1044]mm · 15 of 76 slices shown, 19 images]
[im 6/76  soft-tissue]
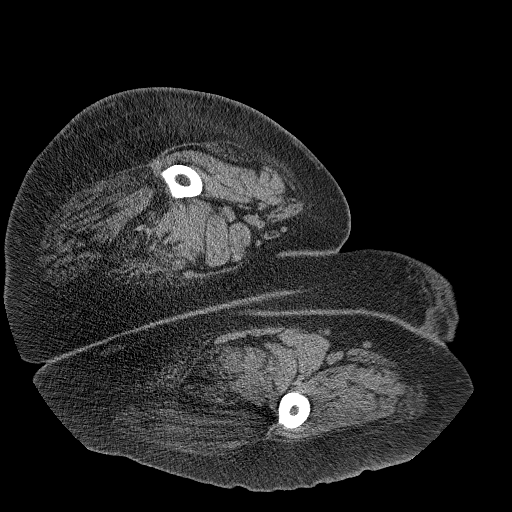
[im 6/76  bone]
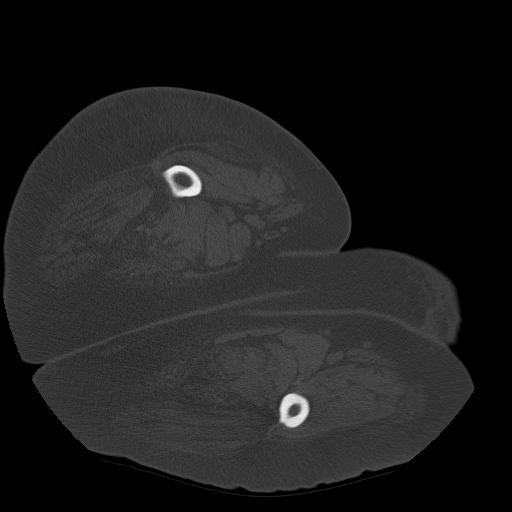
[im 12/76  soft-tissue]
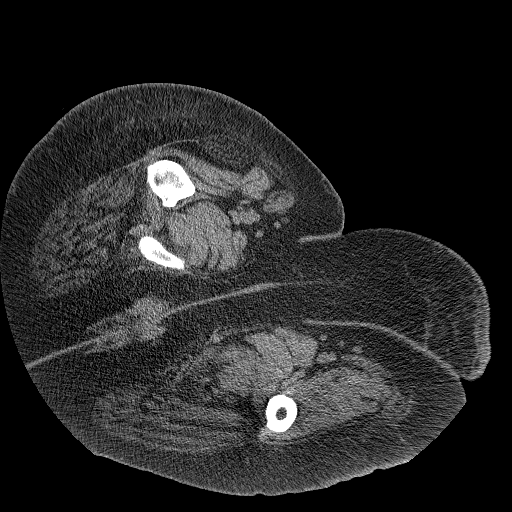
[im 17/76  soft-tissue]
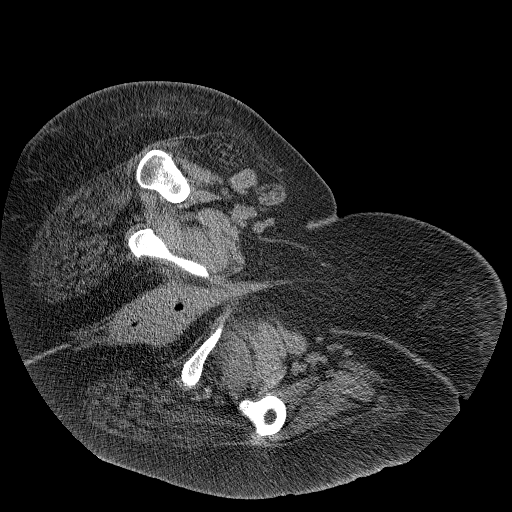
[im 23/76  soft-tissue]
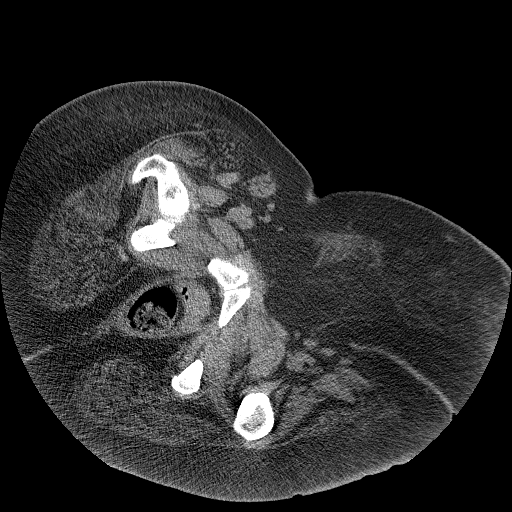
[im 28/76  soft-tissue]
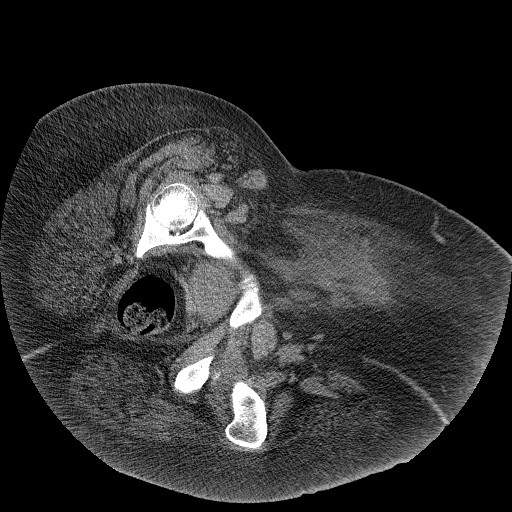
[im 34/76  soft-tissue]
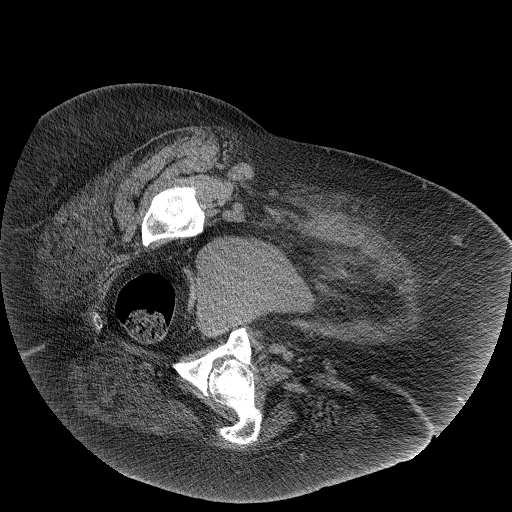
[im 39/76  soft-tissue]
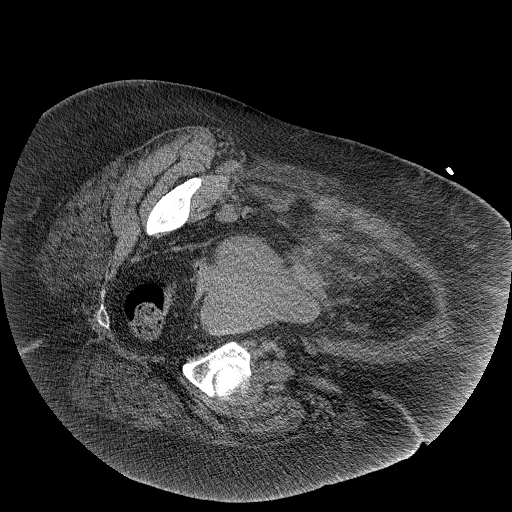
[im 45/76  soft-tissue]
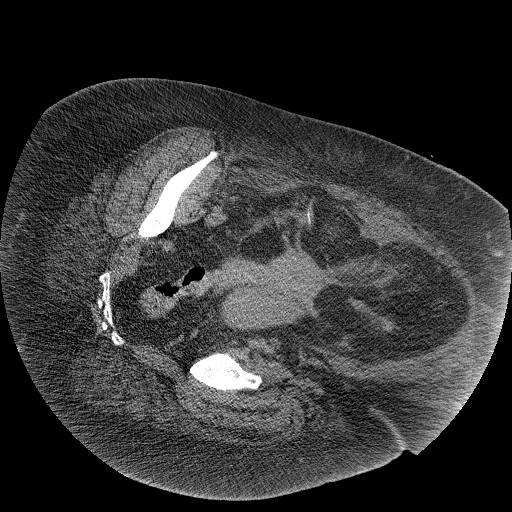
[im 51/76  soft-tissue]
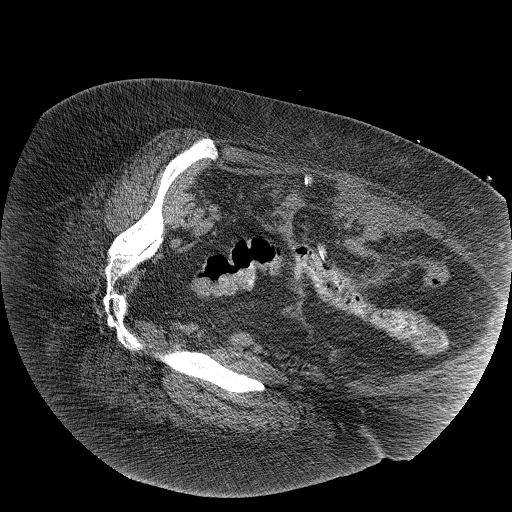
[im 51/76  bone]
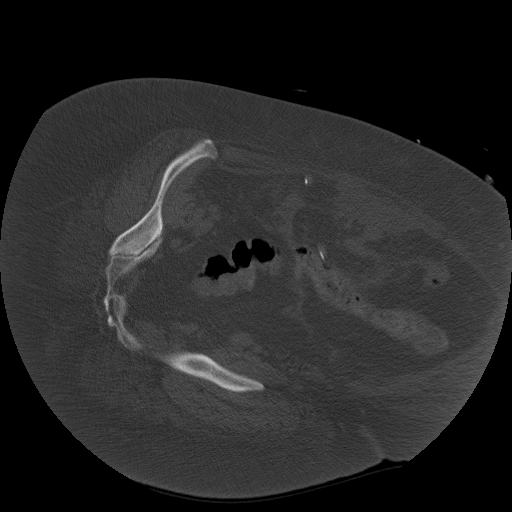
[im 56/76  soft-tissue]
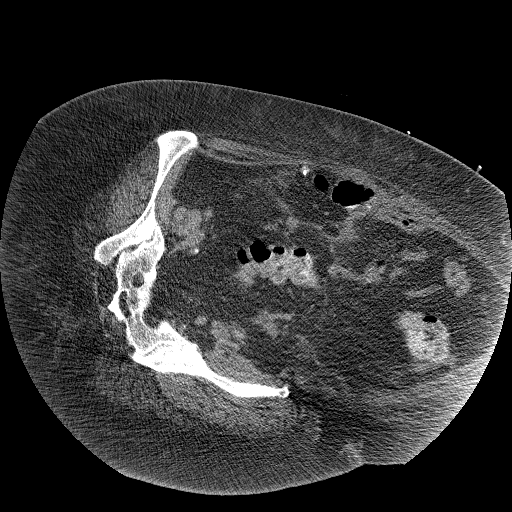
[im 62/76  soft-tissue]
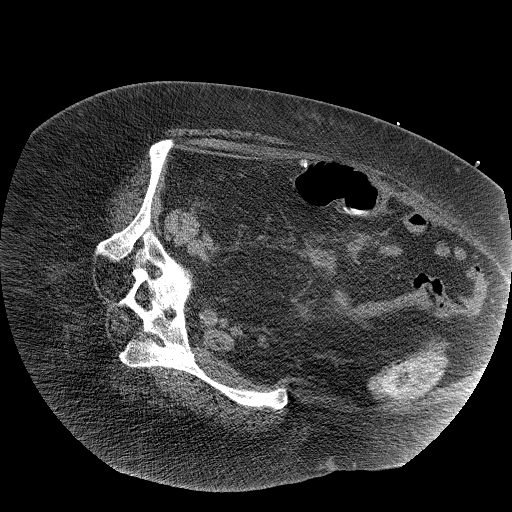
[im 64/76  lung]
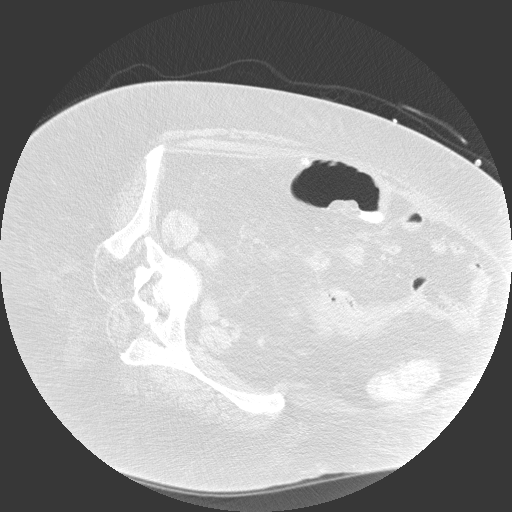
[im 67/76  soft-tissue]
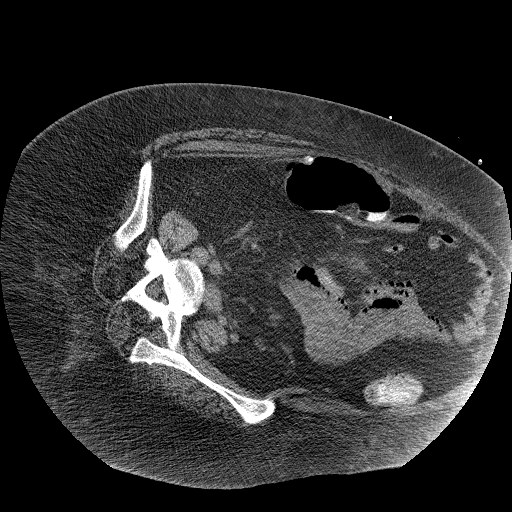
[im 67/76  lung]
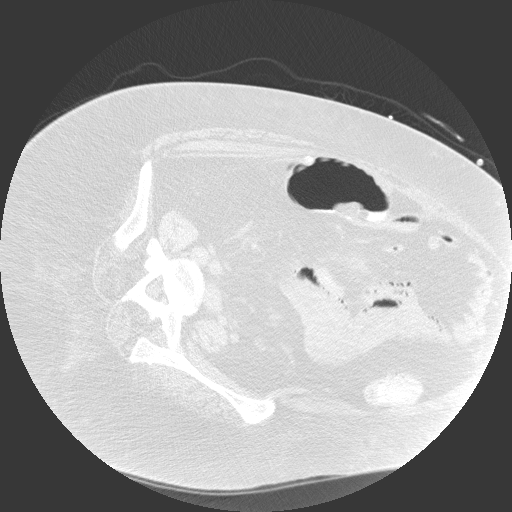
[im 70/76  lung]
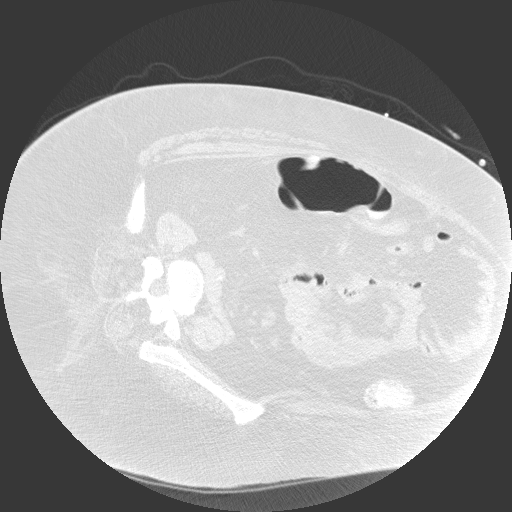
[im 73/76  soft-tissue]
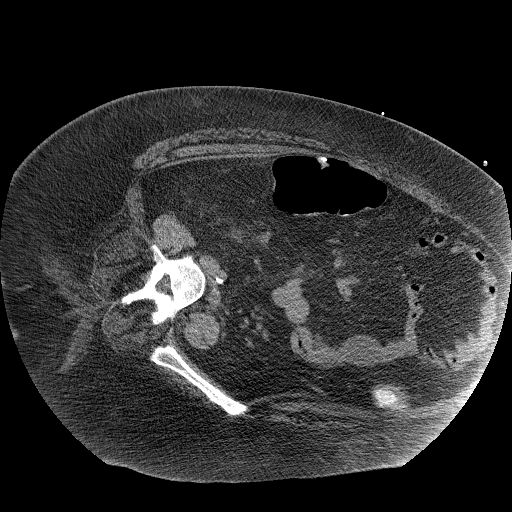
[im 73/76  lung]
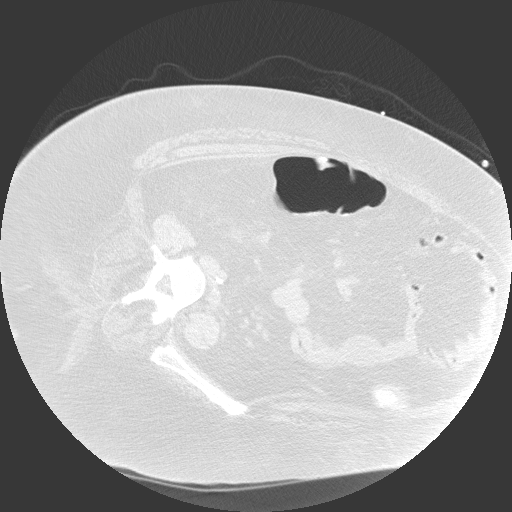

[15 of 32 positions shown; findings below may reference images not displayed]

EXAM:
CT-guided abdominal fluid collection aspiration

MEDICATIONS:
The patient is currently admitted to the hospital and receiving
intravenous antibiotics. The antibiotics were administered within an
appropriate time frame prior to the initiation of the procedure.

ANESTHESIA/SEDATION:
Fentanyl 75 mcg IV; Versed 1.5 mg IV

Moderate Sedation Time:  10

The patient was continuously monitored during the procedure by the
interventional radiology nurse under my direct supervision.

COMPLICATIONS:
None immediate.

PROCEDURE:
Informed written consent was obtained from the patient after a
thorough discussion of the procedural risks, benefits and
alternatives. All questions were addressed. Maximal Sterile Barrier
Technique was utilized including caps, mask, sterile gowns, sterile
gloves, sterile drape, hand hygiene and skin antiseptic. A timeout
was performed prior to the initiation of the procedure.

The right lower quadrant prepped and draped in standard fashion. A
similar appearing ill-defined fluid collection measuring
approximately 8.1 x 3.2 cm in axial dimension was identified on
preprocedure CT in the right lower quadrant. Procedure was planned.
Subdermal Local anesthesia was provided with 2 lidocaine. A small
skin nick was made. Under intermittent CT guidance, an 18 gauge
trocar needle was directed into the central aspect of the fluid
collection. Aspiration was performed which yielded scant serous
fluid. The needle was removed. Sample was sent for culture. The
patient tolerated the procedure well without complication.
IMPRESSION: Technically successful CT-guided right lower quadrant fluid
collection aspiration which yielded scant, serous fluid.

## 2022-03-14 ENCOUNTER — Emergency Department (HOSPITAL_COMMUNITY): Payer: Managed Care, Other (non HMO)

## 2022-03-14 ENCOUNTER — Encounter (HOSPITAL_COMMUNITY)
Admission: EM | Disposition: A | Discharge status: Home / Self Care | Payer: Self-pay | Source: Home / Self Care | Attending: Cardiology

## 2022-03-14 ENCOUNTER — Encounter (HOSPITAL_COMMUNITY): Payer: Self-pay | Admitting: Emergency Medicine

## 2022-03-14 ENCOUNTER — Inpatient Hospital Stay (HOSPITAL_COMMUNITY)
Admission: EM | Admit: 2022-03-14 | Discharge: 2022-03-16 | DRG: 287 | Disposition: A | Discharge status: Home / Self Care | Payer: Managed Care, Other (non HMO) | Attending: Cardiology | Admitting: Cardiology

## 2022-03-14 DIAGNOSIS — I5181 Takotsubo syndrome: Principal | ICD-10-CM | POA: Diagnosis present

## 2022-03-14 DIAGNOSIS — Z794 Long term (current) use of insulin: Secondary | ICD-10-CM | POA: Diagnosis not present

## 2022-03-14 DIAGNOSIS — I428 Other cardiomyopathies: Secondary | ICD-10-CM | POA: Diagnosis present

## 2022-03-14 DIAGNOSIS — E781 Pure hyperglyceridemia: Secondary | ICD-10-CM | POA: Diagnosis present

## 2022-03-14 DIAGNOSIS — I214 Non-ST elevation (NSTEMI) myocardial infarction: Secondary | ICD-10-CM | POA: Diagnosis not present

## 2022-03-14 DIAGNOSIS — E7800 Pure hypercholesterolemia, unspecified: Secondary | ICD-10-CM | POA: Diagnosis present

## 2022-03-14 DIAGNOSIS — Z6841 Body Mass Index (BMI) 40.0 and over, adult: Secondary | ICD-10-CM

## 2022-03-14 DIAGNOSIS — I5020 Unspecified systolic (congestive) heart failure: Secondary | ICD-10-CM | POA: Diagnosis present

## 2022-03-14 DIAGNOSIS — I213 ST elevation (STEMI) myocardial infarction of unspecified site: Principal | ICD-10-CM

## 2022-03-14 DIAGNOSIS — I110 Hypertensive heart disease with heart failure: Secondary | ICD-10-CM | POA: Diagnosis present

## 2022-03-14 DIAGNOSIS — E119 Type 2 diabetes mellitus without complications: Secondary | ICD-10-CM

## 2022-03-14 DIAGNOSIS — E1165 Type 2 diabetes mellitus with hyperglycemia: Secondary | ICD-10-CM

## 2022-03-14 DIAGNOSIS — Z79899 Other long term (current) drug therapy: Secondary | ICD-10-CM

## 2022-03-14 DIAGNOSIS — I10 Essential (primary) hypertension: Secondary | ICD-10-CM | POA: Insufficient documentation

## 2022-03-14 DIAGNOSIS — R079 Chest pain, unspecified: Secondary | ICD-10-CM

## 2022-03-14 DIAGNOSIS — Z23 Encounter for immunization: Secondary | ICD-10-CM | POA: Diagnosis not present

## 2022-03-14 DIAGNOSIS — Z880 Allergy status to penicillin: Secondary | ICD-10-CM

## 2022-03-14 DIAGNOSIS — E118 Type 2 diabetes mellitus with unspecified complications: Secondary | ICD-10-CM | POA: Diagnosis not present

## 2022-03-14 DIAGNOSIS — E7841 Elevated Lipoprotein(a): Secondary | ICD-10-CM | POA: Diagnosis present

## 2022-03-14 DIAGNOSIS — E669 Obesity, unspecified: Secondary | ICD-10-CM | POA: Diagnosis present

## 2022-03-14 HISTORY — PX: LEFT HEART CATH AND CORONARY ANGIOGRAPHY: CATH118249

## 2022-03-14 HISTORY — PX: CORONARY/GRAFT ACUTE MI REVASCULARIZATION: CATH118305

## 2022-03-14 LAB — BASIC METABOLIC PANEL
Anion gap: 14 (ref 5–15)
BUN: 13 mg/dL (ref 6–20)
CO2: 24 mmol/L (ref 22–32)
Calcium: 9.5 mg/dL (ref 8.9–10.3)
Chloride: 97 mmol/L — ABNORMAL LOW (ref 98–111)
Creatinine, Ser: 1.19 mg/dL — ABNORMAL HIGH (ref 0.44–1.00)
GFR, Estimated: 53 mL/min — ABNORMAL LOW (ref >60)
Glucose, Bld: 413 mg/dL — ABNORMAL HIGH (ref 70–99)
Potassium: 4.1 mmol/L (ref 3.5–5.1)
Sodium: 135 mmol/L (ref 135–145)

## 2022-03-14 LAB — CBC
HCT: 47.2 % — ABNORMAL HIGH (ref 36.0–46.0)
Hemoglobin: 16.3 g/dL — ABNORMAL HIGH (ref 12.0–15.0)
MCH: 29.4 pg (ref 26.0–34.0)
MCHC: 34.5 g/dL (ref 30.0–36.0)
MCV: 85 fL (ref 80.0–100.0)
Platelets: 206 10*3/uL (ref 150–400)
RBC: 5.55 MIL/uL — ABNORMAL HIGH (ref 3.87–5.11)
RDW: 13.1 % (ref 11.5–15.5)
WBC: 11.2 10*3/uL — ABNORMAL HIGH (ref 4.0–10.5)
nRBC: 0 % (ref 0.0–0.2)

## 2022-03-14 LAB — TSH: TSH: 1.923 u[IU]/mL (ref 0.350–4.500)

## 2022-03-14 LAB — TROPONIN I (HIGH SENSITIVITY)
Troponin I (High Sensitivity): 7226 ng/L (ref <18)
Troponin I (High Sensitivity): 7943 ng/L (ref <18)

## 2022-03-14 LAB — HEMOGLOBIN A1C
Hgb A1c MFr Bld: 13.5 % — ABNORMAL HIGH (ref 4.8–5.6)
Mean Plasma Glucose: 340.75 mg/dL

## 2022-03-14 LAB — LIPID PANEL
Cholesterol: 437 mg/dL — ABNORMAL HIGH (ref 0–200)
HDL: 41 mg/dL (ref >40)
LDL Cholesterol: UNDETERMINED mg/dL (ref 0–99)
Total CHOL/HDL Ratio: 10.7 RATIO
Triglycerides: 540 mg/dL — ABNORMAL HIGH (ref <150)
VLDL: UNDETERMINED mg/dL (ref 0–40)

## 2022-03-14 LAB — APTT: aPTT: <20 seconds — ABNORMAL LOW (ref 24–36)

## 2022-03-14 LAB — PROTIME-INR
INR: 1 (ref 0.8–1.2)
Prothrombin Time: 13.2 seconds (ref 11.4–15.2)

## 2022-03-14 LAB — LDL CHOLESTEROL, DIRECT: Direct LDL: 292 mg/dL — ABNORMAL HIGH (ref 0–99)

## 2022-03-14 LAB — HIV ANTIBODY (ROUTINE TESTING W REFLEX): HIV Screen 4th Generation wRfx: NONREACTIVE

## 2022-03-14 LAB — CBG MONITORING, ED: Glucose-Capillary: 431 mg/dL — ABNORMAL HIGH (ref 70–99)

## 2022-03-14 LAB — GLUCOSE, CAPILLARY: Glucose-Capillary: 354 mg/dL — ABNORMAL HIGH (ref 70–99)

## 2022-03-14 SURGERY — LEFT HEART CATH AND CORONARY ANGIOGRAPHY
Anesthesia: LOCAL

## 2022-03-14 MED ORDER — HYDRALAZINE HCL 20 MG/ML IJ SOLN: 10.0000 mg | INTRAMUSCULAR | Status: AC | PRN

## 2022-03-14 MED ORDER — SODIUM CHLORIDE 0.9% FLUSH: 3.0000 mL | INTRAVENOUS | Status: DC | PRN

## 2022-03-14 MED ORDER — HEPARIN (PORCINE) IN NACL 1000-0.9 UT/500ML-% IV SOLN
INTRAVENOUS | Status: AC
  Filled 2022-03-14: qty 1000

## 2022-03-14 MED ORDER — VERAPAMIL HCL 2.5 MG/ML IV SOLN
INTRAVENOUS | Status: AC
  Filled 2022-03-14: qty 2

## 2022-03-14 MED ORDER — ENOXAPARIN SODIUM 40 MG/0.4ML IJ SOSY: 40.0000 mg | PREFILLED_SYRINGE | INTRAMUSCULAR | Status: DC

## 2022-03-14 MED ORDER — HEPARIN (PORCINE) IN NACL 1000-0.9 UT/500ML-% IV SOLN
INTRAVENOUS | Status: DC | PRN
  Administered 2022-03-14 (×2): 500 mL

## 2022-03-14 MED ORDER — ASPIRIN 81 MG PO CHEW: 81.0000 mg | CHEWABLE_TABLET | Freq: Every day | ORAL | Status: DC

## 2022-03-14 MED ORDER — SODIUM CHLORIDE 0.9 % IV SOLN: INTRAVENOUS | Status: AC

## 2022-03-14 MED ORDER — ATORVASTATIN CALCIUM 80 MG PO TABS
80.0000 mg | ORAL_TABLET | Freq: Every day | ORAL | Status: DC
  Administered 2022-03-14 – 2022-03-16 (×3): 80 mg via ORAL
  Filled 2022-03-14 (×3): qty 1

## 2022-03-14 MED ORDER — HEPARIN SODIUM (PORCINE) 5000 UNIT/ML IJ SOLN
4000.0000 [IU] | Freq: Once | INTRAMUSCULAR | Status: AC
  Administered 2022-03-14: 4000 [IU] via INTRAVENOUS

## 2022-03-14 MED ORDER — IOHEXOL 350 MG/ML SOLN
INTRAVENOUS | Status: DC | PRN
  Administered 2022-03-14: 120 mL via INTRA_ARTERIAL

## 2022-03-14 MED ORDER — SODIUM CHLORIDE 0.9 % IV SOLN: INTRAVENOUS | Status: DC

## 2022-03-14 MED ORDER — ENOXAPARIN SODIUM 80 MG/0.8ML IJ SOSY: 65.0000 mg | PREFILLED_SYRINGE | INTRAMUSCULAR | Status: DC

## 2022-03-14 MED ORDER — LABETALOL HCL 5 MG/ML IV SOLN: 10.0000 mg | INTRAVENOUS | Status: AC | PRN

## 2022-03-14 MED ORDER — ASPIRIN 81 MG PO TBEC
81.0000 mg | DELAYED_RELEASE_TABLET | Freq: Every day | ORAL | Status: DC
  Administered 2022-03-15 – 2022-03-16 (×2): 81 mg via ORAL
  Filled 2022-03-14 (×2): qty 1

## 2022-03-14 MED ORDER — LIDOCAINE HCL (PF) 1 % IJ SOLN
INTRAMUSCULAR | Status: DC | PRN
  Administered 2022-03-14: 2 mL
  Administered 2022-03-14: 20 mL

## 2022-03-14 MED ORDER — ACETAMINOPHEN 325 MG PO TABS
650.0000 mg | ORAL_TABLET | ORAL | Status: DC | PRN
  Administered 2022-03-15 (×2): 650 mg via ORAL
  Filled 2022-03-14 (×2): qty 2

## 2022-03-14 MED ORDER — NITROGLYCERIN 1 MG/10 ML FOR IR/CATH LAB
INTRA_ARTERIAL | Status: AC
  Filled 2022-03-14: qty 10

## 2022-03-14 MED ORDER — INSULIN ASPART 100 UNIT/ML IJ SOLN
0.0000 [IU] | Freq: Three times a day (TID) | INTRAMUSCULAR | Status: DC
  Administered 2022-03-15: 15 [IU] via SUBCUTANEOUS
  Administered 2022-03-15 (×2): 11 [IU] via SUBCUTANEOUS
  Administered 2022-03-16: 8 [IU] via SUBCUTANEOUS
  Administered 2022-03-16: 11 [IU] via SUBCUTANEOUS
  Administered 2022-03-16: 8 [IU] via SUBCUTANEOUS

## 2022-03-14 MED ORDER — INSULIN ASPART 100 UNIT/ML IJ SOLN
8.0000 [IU] | Freq: Once | INTRAMUSCULAR | Status: AC
  Administered 2022-03-14: 8 [IU] via INTRAVENOUS

## 2022-03-14 MED ORDER — NITROGLYCERIN 0.4 MG SL SUBL: 0.4000 mg | SUBLINGUAL_TABLET | SUBLINGUAL | Status: DC | PRN

## 2022-03-14 MED ORDER — LIDOCAINE HCL (PF) 1 % IJ SOLN
INTRAMUSCULAR | Status: AC
  Filled 2022-03-14: qty 30

## 2022-03-14 MED ORDER — METOPROLOL TARTRATE 12.5 MG HALF TABLET
12.5000 mg | ORAL_TABLET | Freq: Two times a day (BID) | ORAL | Status: DC
  Administered 2022-03-14 – 2022-03-15 (×2): 12.5 mg via ORAL
  Filled 2022-03-14 (×2): qty 1

## 2022-03-14 MED ORDER — MORPHINE SULFATE (PF) 2 MG/ML IV SOLN: 2.0000 mg | INTRAVENOUS | Status: DC | PRN

## 2022-03-14 MED ORDER — ACETAMINOPHEN 325 MG PO TABS: 650.0000 mg | ORAL_TABLET | ORAL | Status: DC | PRN

## 2022-03-14 MED ORDER — ASPIRIN 325 MG PO TABS
325.0000 mg | ORAL_TABLET | Freq: Every day | ORAL | Status: DC
  Administered 2022-03-14: 325 mg via ORAL
  Filled 2022-03-14: qty 1

## 2022-03-14 MED ORDER — FENOFIBRATE 160 MG PO TABS
160.0000 mg | ORAL_TABLET | Freq: Every day | ORAL | Status: DC
  Administered 2022-03-15 – 2022-03-16 (×2): 160 mg via ORAL
  Filled 2022-03-14 (×2): qty 1

## 2022-03-14 MED ORDER — ONDANSETRON HCL 4 MG/2ML IJ SOLN: 4.0000 mg | Freq: Four times a day (QID) | INTRAMUSCULAR | Status: DC | PRN

## 2022-03-14 MED ORDER — HEPARIN SODIUM (PORCINE) 1000 UNIT/ML IJ SOLN
INTRAMUSCULAR | Status: AC
  Filled 2022-03-14: qty 10

## 2022-03-14 MED ORDER — SODIUM CHLORIDE 0.9 % IV SOLN: 250.0000 mL | INTRAVENOUS | Status: DC | PRN

## 2022-03-14 MED ORDER — VERAPAMIL HCL 2.5 MG/ML IV SOLN
INTRA_ARTERIAL | Status: DC | PRN
  Administered 2022-03-14: 8 mL via INTRA_ARTERIAL

## 2022-03-14 MED ORDER — SODIUM CHLORIDE 0.9% FLUSH
3.0000 mL | Freq: Two times a day (BID) | INTRAVENOUS | Status: DC
  Administered 2022-03-15 – 2022-03-16 (×4): 3 mL via INTRAVENOUS

## 2022-03-14 SURGICAL SUPPLY — 18 items
CATH 5FR JL3.5 JR4 ANG PIG MP (CATHETERS) IMPLANT
CATH INFINITI 5FR JL4 (CATHETERS) IMPLANT
CATH OPTITORQUE TIG 4.0 5F (CATHETERS) IMPLANT
DEVICE RAD COMP TR BAND LRG (VASCULAR PRODUCTS) IMPLANT
ELECT DEFIB PAD ADLT CADENCE (PAD) IMPLANT
GLIDESHEATH SLEND A-KIT 6F 22G (SHEATH) IMPLANT
GUIDEWIRE INQWIRE 1.5J.035X260 (WIRE) IMPLANT
INQWIRE 1.5J .035X260CM (WIRE) ×2
KIT ENCORE 26 ADVANTAGE (KITS) IMPLANT
KIT HEART LEFT (KITS) ×1 IMPLANT
PACK CARDIAC CATHETERIZATION (CUSTOM PROCEDURE TRAY) ×1 IMPLANT
SHEATH PINNACLE 6F 10CM (SHEATH) IMPLANT
SHEATH PROBE COVER 6X72 (BAG) IMPLANT
SYR MEDRAD MARK 7 150ML (SYRINGE) ×1 IMPLANT
TRANSDUCER W/STOPCOCK (MISCELLANEOUS) ×1 IMPLANT
TUBING CIL FLEX 10 FLL-RA (TUBING) ×1 IMPLANT
WIRE EMERALD 3MM-J .035X150CM (WIRE) IMPLANT
WIRE HI TORQ VERSACORE-J 145CM (WIRE) IMPLANT

## 2022-03-14 NOTE — ED Provider Notes (Signed)
[facility] EMERGENCY DEPARTMENT Provider Note   CSN: [id number] Arrival date & time: 03/14/22  1719     History  No chief complaint on file.   Diane Lawson is a 61 y.o. female.  Pt is a 61 yo female with a pmhx significant for HTN (since age 89), HLD, and DM2.  Pt is adopted, so she does not know her family hx.  She has never smoked.   Pt said she has had financial difficulties and has been unable to afford her medications for the past 4-6 months.  She developed cp last night.  She said it was severe.  It still continued today, so she came into the ED.  Pain is gone now.       Home Medications Prior to Admission medications   Medication Sig Start Date End Date Taking? Authorizing Provider  acetaminophen (TYLENOL) 500 MG tablet Take 2 tablets (1,000 mg total) by mouth every 6 (six) hours as needed for mild pain, fever or moderate pain. 11/13/20   Adam Phenix, PA-C  atorvastatin (LIPITOR) 80 MG tablet Take 80 mg by mouth daily.    [provider]  buPROPion (WELLBUTRIN XL) 300 MG 24 hr tablet Take 300 mg by mouth daily. 10/28/20   [provider]  Chlorphen-Phenyleph-ASA (ALKA-SELTZER PLUS COLD) 2-7.8-325 MG TBEF Take 1 tablet by mouth every 6 (six) hours as needed (for symptoms- dissolve in water before drinking).    [provider]  Cholecalciferol (VITAMIN D3) 125 MCG (5000 UT) CAPS Take 5,000 Units by mouth daily.    [provider]  hydrochlorothiazide (HYDRODIURIL) 12.5 MG tablet Take 12.5 mg by mouth every morning. 08/11/20   [provider]  Insulin NPH Isophane & Regular (HUMULIN 70/30 [place]) Inject 40 Units into the skin in the morning and at bedtime. Patient not taking: Reported on 11/06/2020    [provider]  Multiple Vitamins-Minerals (HAIR SKIN & NAILS ADVANCED) TABS Take 1 tablet by mouth daily. Patient not taking: Reported on 11/06/2020    [provider]  Multiple Vitamins-Minerals  (HAIR/SKIN/NAILS/BIOTIN) TABS Take 3 tablets by mouth daily.    [provider]  Multiple Vitamins-Minerals (MULTI FOR HER 50+) TABS Take 1 tablet by mouth daily.    [provider]  Multiple Vitamins-Minerals (MULTIVITAMIN GUMMIES ADULT) CHEW Chew 1 tablet by mouth daily.    [provider]  NOVOLIN 70/30 RELION (70-30) 100 UNIT/ML injection Inject 40 Units into the skin in the morning and at bedtime.    [provider]  Potassium 99 MG TABS Take 99 mg by mouth daily.    [provider]  sitaGLIPtin-metformin (JANUMET) 50-1000 MG per tablet Take 1 tablet by mouth 1 day or 1 dose. Take One HS Patient not taking: Reported on 11/06/2020 11/05/13   Carmelina Dane, MD  Vitamin D, Ergocalciferol, 50000 units CAPS Take 5,000 Units by mouth daily. Patient not taking: Reported on 11/06/2020    [provider]      Allergies    Penicillins    Review of Systems   Review of Systems  Cardiovascular:  Positive for chest pain.  All other systems reviewed and are negative.   Physical Exam Updated Vital Signs BP (!) 143/77   Pulse (!) 105   Temp 99.3 F (37.4 C) (Oral)   Resp (!) 22   Ht 5\' 7"  (1.702 m)   Wt 136.1 kg   LMP 10/24/2013   SpO2 96%   BMI 46.99 kg/m  Physical Exam Vitals and nursing note reviewed.  Constitutional:      Appearance: Normal appearance. She is obese.  HENT:     Head: Normocephalic and atraumatic.     Right Ear: External ear normal.     Left Ear: External ear normal.     Nose: Nose normal.     Mouth/Throat:     Mouth: Mucous membranes are dry.  Eyes:     Extraocular Movements: Extraocular movements intact.     Conjunctiva/sclera: Conjunctivae normal.     Pupils: Pupils are equal, round, and reactive to light.  Cardiovascular:     Rate and Rhythm: Normal rate and regular rhythm.     Pulses: Normal pulses.     Heart sounds: Normal heart sounds.  Pulmonary:     Effort: Pulmonary effort is normal.      Breath sounds: Normal breath sounds.  Abdominal:     General: Abdomen is flat. Bowel sounds are normal.     Palpations: Abdomen is soft.  Musculoskeletal:        General: Normal range of motion.     Cervical back: Normal range of motion and neck supple.  Skin:    General: Skin is warm.     Capillary Refill: Capillary refill takes less than 2 seconds.  Neurological:     General: No focal deficit present.     Mental Status: She is alert and oriented to person, place, and time.  Psychiatric:        Mood and Affect: Mood normal.        Behavior: Behavior normal.     ED Results / Procedures / Treatments   Labs (all labs ordered are listed, but only abnormal results are displayed) Labs Reviewed  BASIC METABOLIC PANEL - Abnormal; Notable for the following components:      Result Value   Chloride 97 (*)    Glucose, Bld 413 (*)    Creatinine, Ser 1.19 (*)    GFR, Estimated 53 (*)    All other components within normal limits  CBC - Abnormal; Notable for the following components:   WBC 11.2 (*)    RBC 5.55 (*)    Hemoglobin 16.3 (*)    HCT 47.2 (*)    All other components within normal limits  HEMOGLOBIN A1C - Abnormal; Notable for the following components:   Hgb A1c MFr Bld 13.5 (*)    All other components within normal limits  APTT - Abnormal; Notable for the following components:   aPTT <20 (*)    All other components within normal limits  CBG MONITORING, ED - Abnormal; Notable for the following components:   Glucose-Capillary 431 (*)    All other components within normal limits  TROPONIN I (HIGH SENSITIVITY) - Abnormal; Notable for the following components:   Troponin I (High Sensitivity) 7,226 (*)    All other components within normal limits  TROPONIN I (HIGH SENSITIVITY) - Abnormal; Notable for the following components:   Troponin I (High Sensitivity) [id number] (*)    All other components within normal limits  PROTIME-INR  LIPID PANEL  HIV ANTIBODY (ROUTINE TESTING W  REFLEX)  LIPOPROTEIN A (LPA)  TSH  CBG MONITORING, ED    EKG EKG Interpretation  Date/Time:  Sunday March 14 2022 20:47:53 EDT Ventricular Rate:  86 PR Interval:  184 QRS Duration: 81 QT Interval:  412 QTC Calculation: 493 R Axis:   44 Text Interpretation: Sinus rhythm Abnormal T, consider ischemia, diffuse leads ST elevation, consider  lateral injury EKG looks worse.  Code STEMI called. Confirmed by Jacalyn Lefevre [phone]) on 03/14/2022 9:56:29 PM  Radiology DG Chest 2 View  Result Date: 03/14/2022 CLINICAL DATA:  Chest pain. EXAM: CHEST - 2 VIEW COMPARISON:  July 13, 2011 FINDINGS: The heart size and mediastinal contours are within normal limits. Both lungs are clear. Multilevel degenerative changes are seen within the thoracic spine. IMPRESSION: No active cardiopulmonary disease. Electronically Signed   By: Aram Candela M.D.   On: 03/14/2022 19:22    Procedures Procedures    Medications Ordered in ED Medications  0.9 %  sodium chloride infusion ( Intravenous New Bag/Given 03/14/22 2110)  aspirin EC tablet 81 mg ( Oral Automatically Held 03/23/22 1000)  nitroGLYCERIN (NITROSTAT) SL tablet 0.4 mg ( Sublingual MAR Hold 03/14/22 2151)  acetaminophen (TYLENOL) tablet 650 mg ( Oral MAR Hold 03/14/22 2151)  ondansetron (ZOFRAN) injection 4 mg ( Intravenous MAR Hold 03/14/22 2151)  atorvastatin (LIPITOR) tablet 80 mg ( Oral Automatically Held 03/22/22 1000)  insulin aspart (novoLOG) injection 0-15 Units ( Subcutaneous Automatically Held 03/23/22 1700)  enoxaparin (LOVENOX) injection 65 mg ( Subcutaneous Automatically Held 03/23/22 1000)  heparin injection 4,000 Units (4,000 Units Intravenous Given 03/14/22 2107)  insulin aspart (novoLOG) injection 8 Units (8 Units Intravenous Given 03/14/22 2122)    ED Course/ Medical Decision Making/ A&P                           Medical Decision Making Amount and/or Complexity of Data Reviewed Labs: ordered.  Risk Prescription drug  management. Decision regarding hospitalization.   This patient presents to the ED for concern of cp, this involves an extensive number of treatment options, and is a complaint that carries with it a high risk of complications and morbidity.  The differential diagnosis includes stemi, nstemi, unstable angina   Co morbidities that complicate the patient evaluation  Htn, hld, dm2, obesity   Additional history obtained:  Additional history obtained from epic chart review    Lab Tests:  I Ordered, and personally interpreted labs.  The pertinent results include:  cbc nl, bmp with glucose elevated at 413; hgb a1c is 13.5; trop elevated at 7226.  2nd trop up to 7943.   Imaging Studies ordered:  I ordered imaging studies including cxr  I independently visualized and interpreted imaging which showed  IMPRESSION:  No active cardiopulmonary disease.   I agree with the radiologist interpretation   Cardiac Monitoring:  The patient was maintained on a cardiac monitor.  I personally viewed and interpreted the cardiac monitored which showed an underlying rhythm of: nsr   Medicines ordered and prescription drug management:  I ordered medication including heparin and asa  for stemi  Reevaluation of the patient after these medicines showed that the patient improved I have reviewed the patients home medicines and have made adjustments as needed  Critical Interventions:  Code stemi   Consultations Obtained:  I requested consultation with the cardiologist (Dr. Allyson Sabal),  and discussed lab and imaging findings as well as pertinent plan - they will take her for cath.   Problem List / ED Course:  CP:  initial EKG showed some mild elevation in 2, but 2nd EKG shows worsening elevation and ST changes laterally as well.  Troponin over 7000.  Code stemi called.  Cards came to see pt quickly and took her to the cath lab.     Reevaluation:  After the interventions noted above, I  reevaluated  the patient and found that they have :improved   Social Determinants of Health:  Lives at home.  She's had financial difficulty, but took a payout from her ira and has now paid everything off.     Dispostion:  After consideration of the diagnostic results and the patients response to treatment, I feel that the patent would benefit from admission.    CRITICAL CARE Performed by: Isla Pence   Total critical care time: 30 minutes  Critical care time was exclusive of separately billable procedures and treating other patients.  Critical care was necessary to treat or prevent imminent or life-threatening deterioration.  Critical care was time spent personally by me on the following activities: development of treatment plan with patient and/or surrogate as well as nursing, discussions with consultants, evaluation of patient's response to treatment, examination of patient, obtaining history from patient or surrogate, ordering and performing treatments and interventions, ordering and review of laboratory studies, ordering and review of radiographic studies, pulse oximetry and re-evaluation of patient's condition.         Final Clinical Impression(s) / ED Diagnoses Final diagnoses:  ST elevation myocardial infarction (STEMI), unspecified artery ([place])  Poorly controlled type 2 diabetes mellitus ([place])  Morbid obesity ([place])    Rx / DC Orders ED Discharge Orders     None         Isla Pence, MD 03/14/22 2158

## 2022-03-14 NOTE — ED Triage Notes (Signed)
Pt here from home with c/o chest pain that started last night , no n/v or sob , pt has been out of her meds for about 4 months

## 2022-03-14 NOTE — ED Notes (Signed)
Dr Berry at bedside.   

## 2022-03-14 NOTE — ED Notes (Signed)
Cardiology at bedside.

## 2022-03-14 NOTE — Progress Notes (Signed)
   03/14/22 2122  Clinical Encounter Type  Visited With Patient  Visit Type Initial;Other (Comment) (STEMI)  Referral From Nurse  Consult/Referral To Chaplain   Chaplain responded to a STEMI page. The patient was waiting to be moved to the cath lab for her procedure. I provided support, listening as she anxiously waited. She shared her concerns about her family, job and life in general. 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 It was good to see a family working together.   Danice Goltz [facility]  [phone]

## 2022-03-14 NOTE — ED Provider Triage Note (Signed)
Emergency Medicine Provider Triage Evaluation Note  Diane Lawson , a 61 y.o. female  was evaluated in triage.  Pt complains of chest pain.  Started yesterday evening as a headache, neck pain then went down to her chest bilaterally.  Today it is just the chest pain which is happening every few seconds, and sharp.  No nausea, vomiting or shortness of breath.  Denies any medicine prior to arrival, not anticoagulated no history of PE or ACS.  Review of Systems  Per HPI  Physical Exam  BP (!) 151/87 (BP Location: Right Arm)   Pulse (!) 101   Temp 99.3 F (37.4 C) (Oral)   Resp 18   LMP 10/24/2013   SpO2 97%  Gen:   Awake, no distress   Resp:  Normal effort  MSK:   Moves extremities without difficulty  Other:  Tachycardic mildly   Medical Decision Making  Medically screening exam initiated at 6:11 PM.  Appropriate orders placed.  Diane Lawson was informed that the remainder of the evaluation will be completed by another provider, this initial triage assessment does not replace that evaluation, and the importance of remaining in the ED until their evaluation is complete.     Sherrill Raring, PA-C 03/14/22 1812

## 2022-03-14 NOTE — Progress Notes (Signed)
PHARMACIST - PHYSICIAN COMMUNICATION  CONCERNING:  Enoxaparin (Lovenox) for DVT Prophylaxis    RECOMMENDATION: Patient was prescribed enoxaprin 40mg  q24 hours for VTE prophylaxis.   Filed Weights   03/14/22 2105  Weight: 136.1 kg (300 lb)    Body mass index is 46.99 kg/m.  Estimated Creatinine Clearance: 73.4 mL/min (A) (by C-G formula based on SCr of 1.19 mg/dL (H)).   Based on [place] patient is candidate for enoxaparin 0.5mg /kg TBW SQ every 24 hours based on BMI being >30.  DESCRIPTION: Pharmacy has adjusted enoxaparin dose per [facility] policy.  Patient is now receiving enoxaparin 65 mg every 24 hours    Berta Minor, PharmD Clinical Pharmacist  03/14/2022 9:39 PM

## 2022-03-14 NOTE — H&P (Signed)
Cardiology Admission History and Physical:   Patient ID: Diane Lawson MRN: [id number]; DOB: 31-Aug-1960   Admission date: 03/14/2022  Primary Care Provider: Mila Palmer, MD [facility] Cardiologist: None  [facility] Electrophysiologist:  None   Chief Complaint:  STEMI  Patient Profile:   Diane Lawson is a 61 y.o. female with IDDM2, HTN, HLD, obesity presenting with chest pain.  History of Present Illness:   Diane Lawson was in her normal state of health until 03/13/22 evening at a family member birthday party. She walked inside around 5:30pm, and developed acute onset 12/10 neck pain radiating to the left shoulder and whole chest. It remained unbearable for around 30 minutes, however slowly began to ease off. Overnight, 10/7-10/8, she continued to have severe chest pain, down to a 4-5/10. She slept minimally. Today 10/8 she continued to have chest pain all day long, and eventually decided to come to the ED. She denies having any associated n/v/sob, diaphoresis, CP or SOB on exertion, similar or prior episodes, recent sickness or illness.  In the ED, found to have troponins [phone] with ST elevations in multiple leads. STEMI activated. She received ASA 324mg  and heparin 4000u. Chest pain improved and eventually chest pain free. Taken to the cath lab. In cath lab, found to have clean coronaries, however apex akinetic on ventriculogram. No occult lesions.  On history, patient states had had many recent stressors including home finances, not being able to afford her medications, just started to withdrawal funds from her retirement.    Past Medical History:  Diagnosis Date   Diabetes mellitus without complication (HCC)    Hypertension     Past Surgical History:  Procedure Laterality Date   BACK SURGERY  2007   CESAREAN SECTION     FRACTURE SURGERY     ankle (1999)   LAPAROSCOPIC APPENDECTOMY N/A 11/06/2020   Procedure: APPENDECTOMY LAPAROSCOPIC;   Surgeon: Darnell Level, MD;  Location: [facility] OR;  Service: General;  Laterality: N/A;     Medications Prior to Admission: Prior to Admission medications   Medication Sig Start Date End Date Taking? Authorizing Provider  acetaminophen (TYLENOL) 500 MG tablet Take 2 tablets (1,000 mg total) by mouth every 6 (six) hours as needed for mild pain, fever or moderate pain. 11/13/20   Adam Phenix, PA-C  atorvastatin (LIPITOR) 80 MG tablet Take 80 mg by mouth daily.    [provider]  buPROPion (WELLBUTRIN XL) 300 MG 24 hr tablet Take 300 mg by mouth daily. 10/28/20   [provider]  Chlorphen-Phenyleph-ASA (ALKA-SELTZER PLUS COLD) 2-7.8-325 MG TBEF Take 1 tablet by mouth every 6 (six) hours as needed (for symptoms- dissolve in water before drinking).    [provider]  Cholecalciferol (VITAMIN D3) 125 MCG (5000 UT) CAPS Take 5,000 Units by mouth daily.    [provider]  hydrochlorothiazide (HYDRODIURIL) 12.5 MG tablet Take 12.5 mg by mouth every morning. 08/11/20   [provider]  Insulin NPH Isophane & Regular (HUMULIN 70/30 Paden) Inject 40 Units into the skin in the morning and at bedtime. Patient not taking: Reported on 11/06/2020    [provider]  Multiple Vitamins-Minerals (HAIR SKIN & NAILS ADVANCED) TABS Take 1 tablet by mouth daily. Patient not taking: Reported on 11/06/2020    [provider]  Multiple Vitamins-Minerals (HAIR/SKIN/NAILS/BIOTIN) TABS Take 3 tablets by mouth daily.    [provider]  Multiple Vitamins-Minerals (MULTI FOR HER 50+) TABS Take 1 tablet by mouth daily.  [provider]  Multiple Vitamins-Minerals (MULTIVITAMIN GUMMIES ADULT) CHEW Chew 1 tablet by mouth daily.    [provider]  NOVOLIN 70/30 RELION (70-30) 100 UNIT/ML injection Inject 40 Units into the skin in the morning and at bedtime.    [provider]  Potassium 99 MG TABS Take 99 mg by mouth daily.    [provider]  sitaGLIPtin-metformin (JANUMET) 50-1000 MG per tablet Take 1 tablet by mouth 1 day or 1 dose. Take One HS Patient not taking: Reported on 11/06/2020 11/05/13   Roselee Culver, MD  Vitamin D, Ergocalciferol, 50000 units CAPS Take 5,000 Units by mouth daily. Patient not taking: Reported on 11/06/2020    [provider]     Allergies:    Allergies  Allergen Reactions   Penicillins Shortness Of Breath, Rash and Other (See Comments)    Childhood allergy    Social History:   Social History   Socioeconomic History   Marital status: Widowed    Spouse name: Not on file   Number of children: Not on file   Years of education: Not on file   Highest education level: Not on file  Occupational History   Not on file  Tobacco Use   Smoking status: Never   Smokeless tobacco: Not on file  Substance and Sexual Activity   Alcohol use: No   Drug use: No   Sexual activity: Not on file  Other Topics Concern   Not on file  Social History Narrative   Not on file   Social Determinants of Health   Financial Resource Strain: Not on file  Food Insecurity: Not on file  Transportation Needs: Not on file  Physical Activity: Not on file  Stress: Not on file  Social Connections: Not on file  Intimate Partner Violence: Not on file    Family History:  adoped The patient's family history is not on file. She was adopted.    ROS:   Review of Systems: [y] = yes, [ ]  = no      General: Weight gain [ ] ; Weight loss [ ] ; Anorexia [ ] ; Fatigue [ ] ; Fever [ ] ; Chills [ ] ; Weakness [ ]    Cardiac: Chest pain/pressure [y]; Resting SOB [ ] ; Exertional SOB [ ] ; Orthopnea [ ] ; Pedal Edema [ ] ; Palpitations [ ] ; Syncope [ ] ; Presyncope [ ] ; Paroxysmal nocturnal dyspnea [ ]    Pulmonary: Cough [ ] ; Wheezing [ ] ; Hemoptysis [ ] ; Sputum [ ] ; Snoring [ ]    GI: Vomiting [ ] ; Dysphagia [ ] ; Melena [ ] ; Hematochezia [ ] ; Heartburn [ ] ; Abdominal pain [ ] ; Constipation [ ] ; Diarrhea [ ] ; BRBPR [ ]     GU: Hematuria [ ] ; Dysuria [ ] ; Nocturia [ ]  Vascular: Pain in legs with walking [ ] ; Pain in feet with lying flat [ ] ; Non-healing sores [ ] ; Stroke [ ] ; TIA [ ] ; Slurred speech [ ] ;   Neuro: Headaches [ ] ; Vertigo [ ] ; Seizures [ ] ; Paresthesias [ ] ;Blurred vision [ ] ; Diplopia [ ] ; Vision changes [ ]    Ortho/Skin: Arthritis [ ] ; Joint pain [ ] ; Muscle pain [ ] ; Joint swelling [ ] ; Back Pain [ ] ; Rash [ ]    Psych: Depression [y]; Anxiety [y]   Heme: Bleeding problems [ ] ; Clotting disorders [ ] ; Anemia [ ]    Endocrine: Diabetes [ ] ; Thyroid dysfunction [ ]    Physical Exam/Data:   Vitals:   03/14/22 2250 03/14/22 2255 03/14/22  2300 03/14/22 2321  BP:      Pulse: (!) 0 (!) 0 (!) 0   Resp:      Temp:    98.5 F (36.9 C)  TempSrc:    Oral  SpO2: 93%     Weight:      Height:       No intake or output data in the 24 hours ending 03/14/22 2327    03/14/2022    9:05 PM 11/06/2020    9:57 AM 11/06/2020    6:00 AM  Last 3 Weights  Weight (lbs) 300 lb 320 lb 320 lb  Weight (kg) 136.079 kg 145.151 kg 145.151 kg     Body mass index is 46.99 kg/m.  General:  Well nourished, obese well developed, in no acute distress HEENT: normal Lymph: no adenopathy Neck: no JVD Endocrine:  No thryomegaly Vascular: No carotid bruits; FA pulses 2+ bilaterally without bruits  Cardiac:  normal S1, S2; RRR; no murmur Lungs:  clear to auscultation bilaterally, no wheezing, rhonchi or rales  Abd: soft, nontender, no hepatomegaly  Ext: no edema Musculoskeletal:  No deformities, BUE and BLE strength normal and equal Skin: warm and dry  Neuro:  CNs 2-12 intact, no focal abnormalities noted Psych:  Normal affect   EKG:  The ECG that was done was personally reviewed and demonstrates ST elevations in I, II, V5, V6.   Relevant CV Studies: none  Laboratory Data:  High Sensitivity Troponin:   Recent Labs  Lab 03/14/22 1812 03/14/22 2025  TROPONINIHS 7,226* 7,943*      Chemistry Recent Labs  Lab  03/14/22 1812  NA 135  K 4.1  CL 97*  CO2 24  GLUCOSE 413*  BUN 13  CREATININE 1.19*  CALCIUM 9.5  GFRNONAA 53*  ANIONGAP 14    No results for input(s): "PROT", "ALBUMIN", "AST", "ALT", "ALKPHOS", "BILITOT" in the last 168 hours. Hematology Recent Labs  Lab 03/14/22 1812  WBC 11.2*  RBC 5.55*  HGB 16.3*  HCT 47.2*  MCV 85.0  MCH 29.4  MCHC 34.5  RDW 13.1  PLT 206   BNPNo results for input(s): "BNP", "PROBNP" in the last 168 hours.  DDimer No results for input(s): "DDIMER" in the last 168 hours.  Radiology/Studies:  CARDIAC CATHETERIZATION  Result Date: 03/14/2022 Images from the original result were not included.   There is moderate to severe left ventricular systolic dysfunction.   LV end diastolic pressure is mildly elevated.   The left ventricular ejection fraction is 25-35% by visual estimate. Diane Lawson is a 61 y.o. female  [id number] LOCATION:  FACILITY: [facility] PHYSICIAN: Quay Burow, M.D. 1961/03/29 DATE OF PROCEDURE:  03/14/2022 DATE OF DISCHARGE: CARDIAC CATHETERIZATION History obtained from chart review.  Diane Lawson is a 61 year old severely overweight widowed Caucasian female with multiple cardiac risk factors including treated hypertension, diabetes and hyperlipidemia.  She does not smoke.  She is adopted.  She had chest pain yesterday with back pain and shoulder pain as well for at least a half an hour sustained and after that has had off-and-on chest pain for 24 hours.  She presented to the emergency room where her troponins were 7000 and her EKG showed diffuse ST segment elevation.  She was pain-free.  Because of her EKG changes and positive troponins she was brought to the Cath Lab for angiography potential intervention. PROCEDURE DESCRIPTION: The patient was brought to the second floor [facility] cath lab in the postabsorptive state.  She was not premedicated .  Her right wrist and groin Were prepped and shaved in usual sterile fashion. Xylocaine 1%  was used for local anesthesia. A 6 French sheath was inserted into the right radial artery using standard Seldinger technique.  A 6 French sheath was inserted to the right common femoral vein.  I was unable to cannulate the coronary arteries from the radial approach and defaulted to the femoral approach with Judkins catheters.  Isovue dye is used for the entirety of the case (120 cc total contrast the patient).  Retrograde ordered, ventricular apical pressures were recorded.  LVEDP was 20.    Diane Lawson  has essentially normal coronary arteries.  She has moderate to severe LV dysfunction with wall motion consistent with "Takotsubo syndrome".  The radial sheath was removed and a TR band was placed.  The femoral sheath was removed and pressure was held.  She left lab in stable condition.  2D echo was ordered.  She will need guideline directed to medical therapy for LV dysfunction.  I suspect her LVEF will improve over the next 3 to 6 months. Quay Burow. MD, [facility] 03/14/2022 10:48 PM    DG Chest 2 View  Result Date: 03/14/2022 CLINICAL DATA:  Chest pain. EXAM: CHEST - 2 VIEW COMPARISON:  July 13, 2011 FINDINGS: The heart size and mediastinal contours are within normal limits. Both lungs are clear. Multilevel degenerative changes are seen within the thoracic spine. IMPRESSION: No active cardiopulmonary disease. Electronically Signed   By: Virgina Norfolk M.D.   On: 03/14/2022 19:22      TIMI Risk Score for ST  Elevation MI:   The patient's TIMI risk score is 1, which indicates a 1.6% risk of all cause mortality at 30 days.      Assessment and Plan:   C/f Takasubo Cardiomyopathy STEMI Presenting with 24 hours of acute chest pain, found to have hsTnT 7226 -> 7943 and ST elevations in I, II, V5, V6 c/f STEMI. Taken to cath lab 10/8 evening, found to have normal coronaries, however akinetic apex, altogether c/f takasubo cardiomyopathy. She reports multiple recent stressors including finances and loss  of family members, all possible triggers. Ddx also includes vasospasm with resolution by time of cath, less likely prior type 1 event with complete resolution of thrombus so not visible on LHC. Plan for close monitoring in CCU and further evaluation of possible takasubo. -TTE -Risk Stratification: lipid panel, a1c -Telemetry -Start metoprolol tartrate 12.5mg  BID for reduction in andronergic tone and GDMT given suspected reduced EF on LHC -Will likely need rest of GDMT including ACE/ARB/ARNI, MRA, SGLT-2 -Admit to ICU  IDDM2, uncontrolled A1c 13.5%, not currently using prior 70/30 insulin due to recent financial struggles, however reports can now afford again. -Endocrine consult in am -SSI overnight, goal to reduce glucose to <300 by am -Holding 70/30 for now until endo sees in am  Hypertriglyceridemia, uncontrolled Triglycerides 540 on admission. -start fenofibrate 160mg  daily  Hx Depression Previously on bupropion, has not been taking recently due to costs. -Holding bupropion for now, consider restart pending clinical course   Severity of Illness: The appropriate patient status for this patient is INPATIENT. Inpatient status is judged to be reasonable and necessary in order to provide the required intensity of service to ensure the patient's safety. The patient's presenting symptoms, physical exam findings, and initial radiographic and laboratory data in the context of their chronic comorbidities is felt to place them at high risk for further clinical deterioration. Furthermore, it is not anticipated that the  patient will be medically stable for discharge from the hospital within 2 midnights of admission.   * I certify that at the point of admission it is my clinical judgment that the patient will require inpatient hospital care spanning beyond 2 midnights from the point of admission due to high intensity of service, high risk for further deterioration and high frequency of surveillance  required.*   For questions or updates, please contact [facility] Please consult [email or web address] for contact info under     Signed, Bronson Curb, MD  03/14/2022 11:27 PM

## 2022-03-14 NOTE — ED Notes (Signed)
After critical troponin result, pt pulled to triage from waiting room. Pt given medication and IV access started.  Lia Hopping PA and Charge RN [employer] informed of critical troponin.

## 2022-03-14 NOTE — ED Notes (Signed)
Pt calling family to update them - Chaplin speaking to pt at bedside

## 2022-03-15 ENCOUNTER — Other Ambulatory Visit (HOSPITAL_COMMUNITY): Payer: Self-pay

## 2022-03-15 ENCOUNTER — Encounter (HOSPITAL_COMMUNITY): Payer: Self-pay | Admitting: Cardiovascular Disease

## 2022-03-15 ENCOUNTER — Other Ambulatory Visit: Payer: Self-pay

## 2022-03-15 ENCOUNTER — Inpatient Hospital Stay (HOSPITAL_COMMUNITY): Payer: Managed Care, Other (non HMO)

## 2022-03-15 DIAGNOSIS — Z794 Long term (current) use of insulin: Secondary | ICD-10-CM

## 2022-03-15 DIAGNOSIS — I428 Other cardiomyopathies: Secondary | ICD-10-CM

## 2022-03-15 DIAGNOSIS — E781 Pure hyperglyceridemia: Secondary | ICD-10-CM

## 2022-03-15 DIAGNOSIS — I214 Non-ST elevation (NSTEMI) myocardial infarction: Secondary | ICD-10-CM

## 2022-03-15 DIAGNOSIS — E1165 Type 2 diabetes mellitus with hyperglycemia: Secondary | ICD-10-CM

## 2022-03-15 DIAGNOSIS — I5181 Takotsubo syndrome: Secondary | ICD-10-CM | POA: Diagnosis not present

## 2022-03-15 LAB — ECHOCARDIOGRAM COMPLETE
Area-P 1/2: 3.99 cm2
Calc EF: 49.8 %
Height: 67 in
S' Lateral: 2.9 cm
Single Plane A2C EF: 50.7 %
Single Plane A4C EF: 46.7 %
Weight: 4797.21 oz

## 2022-03-15 LAB — GLUCOSE, CAPILLARY
Glucose-Capillary: 373 mg/dL — ABNORMAL HIGH (ref 70–99)
Glucose-Capillary: 305 mg/dL — ABNORMAL HIGH (ref 70–99)
Glucose-Capillary: 414 mg/dL — ABNORMAL HIGH (ref 70–99)
Glucose-Capillary: 424 mg/dL — ABNORMAL HIGH (ref 70–99)
Glucose-Capillary: 324 mg/dL — ABNORMAL HIGH (ref 70–99)
Glucose-Capillary: 379 mg/dL — ABNORMAL HIGH (ref 70–99)

## 2022-03-15 LAB — BASIC METABOLIC PANEL
Anion gap: 16 — ABNORMAL HIGH (ref 5–15)
BUN: 14 mg/dL (ref 6–20)
CO2: 26 mmol/L (ref 22–32)
Calcium: 9.6 mg/dL (ref 8.9–10.3)
Chloride: 94 mmol/L — ABNORMAL LOW (ref 98–111)
Creatinine, Ser: 1.34 mg/dL — ABNORMAL HIGH (ref 0.44–1.00)
GFR, Estimated: 46 mL/min — ABNORMAL LOW (ref >60)
Glucose, Bld: 354 mg/dL — ABNORMAL HIGH (ref 70–99)
Potassium: 3.8 mmol/L (ref 3.5–5.1)
Sodium: 136 mmol/L (ref 135–145)

## 2022-03-15 LAB — CBC
HCT: 45.3 % (ref 36.0–46.0)
Hemoglobin: 15.5 g/dL — ABNORMAL HIGH (ref 12.0–15.0)
MCH: 29.2 pg (ref 26.0–34.0)
MCHC: 34.2 g/dL (ref 30.0–36.0)
MCV: 85.3 fL (ref 80.0–100.0)
Platelets: 181 10*3/uL (ref 150–400)
RBC: 5.31 MIL/uL — ABNORMAL HIGH (ref 3.87–5.11)
RDW: 13.2 % (ref 11.5–15.5)
WBC: 9.4 10*3/uL (ref 4.0–10.5)
nRBC: 0 % (ref 0.0–0.2)

## 2022-03-15 LAB — GLUCOSE, RANDOM: Glucose, Bld: 412 mg/dL — ABNORMAL HIGH (ref 70–99)

## 2022-03-15 LAB — BRAIN NATRIURETIC PEPTIDE: B Natriuretic Peptide: 799.6 pg/mL — ABNORMAL HIGH (ref 0.0–100.0)

## 2022-03-15 LAB — MRSA NEXT GEN BY PCR, NASAL: MRSA by PCR Next Gen: NOT DETECTED

## 2022-03-15 MED ORDER — LIVING WELL WITH DIABETES BOOK
Freq: Once | Status: AC
  Filled 2022-03-15: qty 1

## 2022-03-15 MED ORDER — INFLUENZA VAC SPLIT QUAD 0.5 ML IM SUSY
0.5000 mL | PREFILLED_SYRINGE | INTRAMUSCULAR | Status: AC
  Administered 2022-03-16: 0.5 mL via INTRAMUSCULAR
  Filled 2022-03-15: qty 0.5

## 2022-03-15 MED ORDER — INSULIN GLARGINE-YFGN 100 UNIT/ML ~~LOC~~ SOLN
20.0000 [IU] | Freq: Once | SUBCUTANEOUS | Status: DC
  Filled 2022-03-15: qty 0.2

## 2022-03-15 MED ORDER — INSULIN ASPART 100 UNIT/ML IJ SOLN
12.0000 [IU] | Freq: Once | INTRAMUSCULAR | Status: AC
  Administered 2022-03-15: 12 [IU] via INTRAVENOUS

## 2022-03-15 MED ORDER — METOPROLOL TARTRATE 25 MG PO TABS
25.0000 mg | ORAL_TABLET | Freq: Two times a day (BID) | ORAL | Status: DC
  Administered 2022-03-15: 25 mg via ORAL
  Filled 2022-03-15 (×2): qty 1

## 2022-03-15 MED ORDER — PERFLUTREN LIPID MICROSPHERE
1.0000 mL | INTRAVENOUS | Status: AC | PRN
  Administered 2022-03-15: 6 mL via INTRAVENOUS

## 2022-03-15 MED ORDER — CHLORHEXIDINE GLUCONATE CLOTH 2 % EX PADS
6.0000 | MEDICATED_PAD | Freq: Every day | CUTANEOUS | Status: DC
  Administered 2022-03-15 – 2022-03-16 (×2): 6 via TOPICAL

## 2022-03-15 MED ORDER — INSULIN ASPART 100 UNIT/ML IJ SOLN
5.0000 [IU] | Freq: Three times a day (TID) | INTRAMUSCULAR | Status: DC
  Administered 2022-03-15: 5 [IU] via SUBCUTANEOUS

## 2022-03-15 MED ORDER — INSULIN GLARGINE-YFGN 100 UNIT/ML ~~LOC~~ SOLN
50.0000 [IU] | Freq: Every day | SUBCUTANEOUS | Status: DC
  Administered 2022-03-16: 50 [IU] via SUBCUTANEOUS
  Filled 2022-03-15: qty 0.5

## 2022-03-15 MED ORDER — INSULIN GLARGINE-YFGN 100 UNIT/ML ~~LOC~~ SOLN
30.0000 [IU] | Freq: Every day | SUBCUTANEOUS | Status: DC
  Administered 2022-03-15: 30 [IU] via SUBCUTANEOUS
  Filled 2022-03-15: qty 0.3

## 2022-03-15 MED ORDER — INSULIN ASPART 100 UNIT/ML IJ SOLN
8.0000 [IU] | Freq: Three times a day (TID) | INTRAMUSCULAR | Status: DC
  Administered 2022-03-15 – 2022-03-16 (×2): 8 [IU] via SUBCUTANEOUS

## 2022-03-15 MED ORDER — INSULIN ASPART 100 UNIT/ML IJ SOLN
8.0000 [IU] | Freq: Once | INTRAMUSCULAR | Status: AC
  Administered 2022-03-15: 8 [IU] via SUBCUTANEOUS

## 2022-03-15 MED ORDER — PNEUMOCOCCAL 20-VAL CONJ VACC 0.5 ML IM SUSY
0.5000 mL | PREFILLED_SYRINGE | INTRAMUSCULAR | Status: AC
  Administered 2022-03-16: 0.5 mL via INTRAMUSCULAR
  Filled 2022-03-15: qty 0.5

## 2022-03-15 NOTE — Progress Notes (Signed)
Tereasa Coop, MD notified of CBG: 373. Orders given for 12 units insulin. Order placed as IV. Clarified order with MD, insulin to be given subcutaneous.

## 2022-03-15 NOTE — Progress Notes (Signed)
Heart Failure Stewardship Pharmacist Progress Note   PCP: Jonathon Jordan, MD PCP-Cardiologist: Buford Dresser, MD    HPI:  61 yo F with PMH of HTN, HLD, obesity, and T2DM.  Presented to the ED on 10/8 with chest pain. In the ED, found to have elevated troponins with ST elevations in multiple leads. STEMI activated. Taken to cath lab and found to have clean coronaries but concern for Takotsubo cardiomyopathy. Reports multiple recent stressors. CXR without cardiopulmonary disease. ECHO on 10/9 showed LVEF 45-50%, regional wall motion abnormalities, G1DD, RV normal.   Current HF Medications: Beta Blocker: metoprolol tartrate 12.5 mg BID  Prior to admission HF Medications: None - out of medications x 4-6 months Pertinent Lab Values: Serum creatinine 1.34, BUN 14, Potassium 3.8, Sodium 136, BNP 799.6, A1c 13.5   Vital Signs: Weight: 299 lbs (admission weight: 299 lbs) Blood pressure: 110-140/80s  Heart rate: 70-90s   Medication Assistance / Insurance Benefits Check: Does the patient have prescription insurance?  Yes Type of insurance plan: Art [employer]  Outpatient Pharmacy:  Prior to admission outpatient pharmacy: [facility] Is the patient willing to use [facility] at discharge? Yes Is the patient willing to transition their outpatient pharmacy to utilize a [facility] outpatient pharmacy?   Pending   Assessment: 1. 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 NYHA class II symptoms. - Not volume overloaded on exam, no indication for diuretics - Continue metoprolol tartrate 12.5 mg BID - Consider starting SGLT2i for GDMT optimization, both need prior authorizations. Can complete this if added.    Plan: 1) Medication changes recommended at this time: - Add Farxiga 10 mg daily  2) Patient assistance: - Entresto copay [phone], copay card lowers to $10 per fill - Farxiga/Jardiance needs prior authorization - Copay card for [place] lowers to $0 per month -  Copay card for Jardiance lowers to $10 per month  3)  Education  - To be completed prior to discharge  Kerby Nora, PharmD, BCPS Heart Failure Stewardship Pharmacist Phone [phone]

## 2022-03-15 NOTE — Progress Notes (Signed)
Heart Failure Nurse Navigator Progress Note  Spoke with bedside RN. Pt currently receiving bedside testing. Will interview later this AM for HV TOC readiness. Plan for HV TOC follow up appt., more generalized HF education and time spent answering questions.   Pricilla Holm, MSN, RN Heart Failure Nurse Navigator

## 2022-03-15 NOTE — Plan of Care (Signed)
  Problem: Education: Goal: Understanding of cardiac disease, CV risk reduction, and recovery process will improve Outcome: Progressing   Problem: Activity: Goal: Ability to tolerate increased activity will improve Outcome: Progressing   Problem: Cardiac: Goal: Ability to achieve and maintain adequate cardiovascular perfusion will improve Outcome: Progressing   Problem: Health Behavior/Discharge Planning: Goal: Ability to safely manage health-related needs after discharge will improve Outcome: Progressing   Problem: Coping: Goal: Ability to adjust to condition or change in health will improve Outcome: Progressing   Problem: Fluid Volume: Goal: Ability to maintain a balanced intake and output will improve Outcome: Progressing   Problem: Health Behavior/Discharge Planning: Goal: Ability to identify and utilize available resources and services will improve Outcome: Progressing   Problem: Metabolic: Goal: Ability to maintain appropriate glucose levels will improve Outcome: Progressing   Problem: Nutritional: Goal: Progress toward achieving an optimal weight will improve Outcome: Progressing   Problem: Skin Integrity: Goal: Risk for impaired skin integrity will decrease Outcome: Progressing   Problem: Tissue Perfusion: Goal: Adequacy of tissue perfusion will improve Outcome: Progressing   Problem: Education: Goal: Knowledge of General Education information will improve Description: Including pain rating scale, medication(s)/side effects and non-pharmacologic comfort measures Outcome: Progressing   Problem: Health Behavior/Discharge Planning: Goal: Ability to manage health-related needs will improve Outcome: Progressing   Problem: Clinical Measurements: Goal: Ability to maintain clinical measurements within normal limits will improve Outcome: Progressing Goal: Will remain free from infection Outcome: Progressing Goal: Cardiovascular complication will be  avoided Outcome: Progressing   Problem: Coping: Goal: Level of anxiety will decrease Outcome: Progressing   Problem: Elimination: Goal: Will not experience complications related to urinary retention Outcome: Progressing   Problem: Pain Managment: Goal: General experience of comfort will improve Outcome: Progressing   Problem: Safety: Goal: Ability to remain free from injury will improve Outcome: Progressing   Problem: Skin Integrity: Goal: Risk for impaired skin integrity will decrease Outcome: Progressing

## 2022-03-15 NOTE — Progress Notes (Signed)
Rounding Note    Patient Name: Diane Lawson Date of Encounter: 03/15/2022  [facility] Cardiologist: Jodelle Red, MD   Subjective   Reviewed cath from overnight, echo. She feels better but continues to feel like someone punched her in the chest. Sugars have been elevated, requiring multiple adjustments in insulin regimen.  Inpatient Medications    Scheduled Meds:  aspirin EC  81 mg Oral Daily   atorvastatin  80 mg Oral Daily   Chlorhexidine Gluconate Cloth  6 each Topical Daily   [START ON 03/17/2022] enoxaparin (LOVENOX) injection  65 mg Subcutaneous Q24H   fenofibrate  160 mg Oral Daily   [START ON 03/16/2022] influenza vac split quadrivalent PF  0.5 mL Intramuscular Tomorrow-1000   insulin aspart  0-15 Units Subcutaneous TID WC   insulin aspart  8 Units Subcutaneous TID WC   insulin glargine-yfgn  20 Units Subcutaneous Once   [START ON 03/16/2022] insulin glargine-yfgn  50 Units Subcutaneous Daily   metoprolol tartrate  12.5 mg Oral BID   [START ON 03/16/2022] pneumococcal 20-valent conjugate vaccine  0.5 mL Intramuscular Tomorrow-1000   sodium chloride flush  3 mL Intravenous Q12H   Continuous Infusions:  sodium chloride 15 mL/hr at 03/15/22 0900   sodium chloride     PRN Meds: sodium chloride, acetaminophen, morphine injection, nitroGLYCERIN, ondansetron (ZOFRAN) IV, sodium chloride flush   Vital Signs    Vitals:   03/15/22 1300 03/15/22 1400 03/15/22 1500 03/15/22 1600  BP: 116/67 132/89 123/66 132/79  Pulse: 75 77 79 77  Resp: 17 18 (!) 25 16  Temp:    98.2 F (36.8 C)  TempSrc:    Oral  SpO2: 94% 92% 92% 92%  Weight:      Height:        Intake/Output Summary (Last 24 hours) at 03/15/2022 1731 Last data filed at 03/15/2022 1130 Gross per 24 hour  Intake 1148.28 ml  Output 500 ml  Net 648.28 ml      03/14/2022   11:20 PM 03/14/2022    9:05 PM 11/06/2020    9:57 AM  Last 3 Weights  Weight (lbs) 299 lb 13.2 oz 300 lb 320 lb   Weight (kg) 136 kg 136.079 kg 145.151 kg      Telemetry    SR - Personally Reviewed  ECG    Sinus with resolving ST changes - Personally Reviewed  Physical Exam   GEN: No acute distress.   Neck: No JVD Cardiac: RRR, no murmurs, rubs, or gallops.  Respiratory: Clear to auscultation bilaterally. GI: Soft, nontender, non-distended  MS: No edema; No deformity. Neuro:  Nonfocal  Psych: Normal affect   Labs    High Sensitivity Troponin:   Recent Labs  Lab 03/14/22 1812 03/14/22 2025  TROPONINIHS 7,226* 7,943*     Chemistry Recent Labs  Lab 03/14/22 1812 03/15/22 0104 03/15/22 1331  NA 135 136  --   K 4.1 3.8  --   CL 97* 94*  --   CO2 24 26  --   GLUCOSE 413* 354* 412*  BUN 13 14  --   CREATININE 1.19* 1.34*  --   CALCIUM 9.5 9.6  --   GFRNONAA 53* 46*  --   ANIONGAP 14 16*  --     Lipids  Recent Labs  Lab 03/14/22 2025  CHOL 437*  TRIG 540*  HDL 41  LDLCALC UNABLE TO CALCULATE IF TRIGLYCERIDE OVER 400 mg/dL  CHOLHDL 22.0    Hematology Recent Labs  Lab 03/14/22 1812 03/15/22 0104  WBC 11.2* 9.4  RBC 5.55* 5.31*  HGB 16.3* 15.5*  HCT 47.2* 45.3  MCV 85.0 85.3  MCH 29.4 29.2  MCHC 34.5 34.2  RDW 13.1 13.2  PLT 206 181   Thyroid  Recent Labs  Lab 03/14/22 2025  TSH 1.923    BNP Recent Labs  Lab 03/15/22 0104  BNP 799.6*    DDimer No results for input(s): "DDIMER" in the last 168 hours.   Radiology    ECHOCARDIOGRAM COMPLETE  Result Date: 03/15/2022    ECHOCARDIOGRAM REPORT   Patient Name:   [name] Date of Exam: 03/15/2022 Medical Rec #:  [id number]           Height:       67.0 in Accession #:    [id number]          Weight:       299.8 lb Date of Birth:  Jan 17, 1961           BSA:          2.402 m Patient Age:    61 years            BP:           122/89 mmHg Patient Gender: F                   HR:           90 bpm. Exam Location:  Inpatient Procedure: 2D Echo, Cardiac Doppler, Color Doppler and Intracardiac             Opacification Agent Indications:    Acute myocardial infarction, unspecified I21.9  History:        Patient has no prior history of Echocardiogram examinations.                 Risk Factors:Hypertension and Diabetes.  Sonographer:    Eulah Pont RDCS Referring Phys: [id number] Freddy Finner IMPRESSIONS  1. Left ventricular ejection fraction, by estimation, is 45 to 50%. The left ventricle has mildly decreased function. The left ventricle demonstrates regional wall motion abnormalities (see scoring diagram/findings for description). Left ventricular diastolic parameters are consistent with Grade I diastolic dysfunction (impaired relaxation).  2. Right ventricular systolic function is normal. The right ventricular size is normal.  3. The mitral valve is normal in structure. No evidence of mitral valve regurgitation. No evidence of mitral stenosis.  4. The aortic valve is normal in structure. Aortic valve regurgitation is not visualized. No aortic stenosis is present.  5. The inferior vena cava is normal in size with greater than 50% respiratory variability, suggesting right atrial pressure of 3 mmHg. FINDINGS  Left Ventricle: Left ventricular ejection fraction, by estimation, is 45 to 50%. The left ventricle has mildly decreased function. The left ventricle demonstrates regional wall motion abnormalities. Definity contrast agent was given IV to delineate the left ventricular endocardial borders. The left ventricular internal cavity size was normal in size. There is no left ventricular hypertrophy. Left ventricular diastolic parameters are consistent with Grade I diastolic dysfunction (impaired relaxation).  LV Wall Scoring: The entire apex is dyskinetic. Right Ventricle: The right ventricular size is normal. No increase in right ventricular wall thickness. Right ventricular systolic function is normal. Left Atrium: Left atrial size was normal in size. Right Atrium: Right atrial size was normal in size. Pericardium:  There is no evidence of pericardial effusion. Mitral Valve: The mitral valve is normal in structure. No evidence of  mitral valve regurgitation. No evidence of mitral valve stenosis. Tricuspid Valve: The tricuspid valve is normal in structure. Tricuspid valve regurgitation is not demonstrated. No evidence of tricuspid stenosis. Aortic Valve: The aortic valve is normal in structure. Aortic valve regurgitation is not visualized. No aortic stenosis is present. Pulmonic Valve: The pulmonic valve was normal in structure. Pulmonic valve regurgitation is not visualized. No evidence of pulmonic stenosis. Aorta: The aortic root is normal in size and structure. Venous: The inferior vena cava is normal in size with greater than 50% respiratory variability, suggesting right atrial pressure of 3 mmHg. IAS/Shunts: No atrial level shunt detected by color flow Doppler.  LEFT VENTRICLE PLAX 2D LVIDd:         4.30 cm      Diastology LVIDs:         2.90 cm      LV e' medial:    5.03 cm/s LV PW:         1.00 cm      LV E/e' medial:  14.0 LV IVS:        1.00 cm      LV e' lateral:   6.31 cm/s LVOT diam:     2.10 cm      LV E/e' lateral: 11.2 LV SV:         79 LV SV Index:   33 LVOT Area:     3.46 cm  LV Volumes (MOD) LV vol d, MOD A2C: 88.1 ml LV vol d, MOD A4C: 120.0 ml LV vol s, MOD A2C: 43.4 ml LV vol s, MOD A4C: 64.0 ml LV SV MOD A2C:     44.7 ml LV SV MOD A4C:     120.0 ml LV SV MOD BP:      54.4 ml RIGHT VENTRICLE RV S prime:     11.80 cm/s TAPSE (M-mode): 1.8 cm LEFT ATRIUM             Index        RIGHT ATRIUM           Index LA diam:        3.90 cm 1.62 cm/m   RA Area:     13.70 cm LA Vol (A2C):   57.3 ml 23.86 ml/m  RA Volume:   31.50 ml  13.11 ml/m LA Vol (A4C):   61.9 ml 25.77 ml/m LA Biplane Vol: 60.3 ml 25.10 ml/m  AORTIC VALVE LVOT Vmax:   103.00 cm/s LVOT Vmean:  78.100 cm/s LVOT VTI:    0.227 m  AORTA Ao Root diam: 3.30 cm Ao Asc diam:  3.30 cm MITRAL VALVE MV Area (PHT): 3.99 cm    SHUNTS MV Decel Time: 190 msec     Systemic VTI:  0.23 m MV E velocity: 70.60 cm/s  Systemic Diam: 2.10 cm MV A velocity: 66.50 cm/s MV E/A ratio:  1.06 Candee Furbish MD Electronically signed by Candee Furbish MD Signature Date/Time: 03/15/2022/11:15:08 AM    Final    CARDIAC CATHETERIZATION  Result Date: 03/14/2022 Images from the original result were not included.   There is moderate to severe left ventricular systolic dysfunction.   LV end diastolic pressure is mildly elevated.   The left ventricular ejection fraction is 25-35% by visual estimate. Kara Collier-Bullis is a 61 y.o. female  [id number] LOCATION:  FACILITY: [facility] PHYSICIAN: Quay Burow, M.D. 12-16-1960 DATE OF PROCEDURE:  03/14/2022 DATE OF DISCHARGE: CARDIAC CATHETERIZATION History obtained from chart review.  Ms. [name] is a 61 year old severely  overweight widowed Caucasian female with multiple cardiac risk factors including treated hypertension, diabetes and hyperlipidemia.  She does not smoke.  She is adopted.  She had chest pain yesterday with back pain and shoulder pain as well for at least a half an hour sustained and after that has had off-and-on chest pain for 24 hours.  She presented to the emergency room where her troponins were 7000 and her EKG showed diffuse ST segment elevation.  She was pain-free.  Because of her EKG changes and positive troponins she was brought to the Cath Lab for angiography potential intervention. PROCEDURE DESCRIPTION: The patient was brought to the second floor [facility] cath lab in the postabsorptive state.  She was not premedicated .  Her right wrist and groin Were prepped and shaved in usual sterile fashion. Xylocaine 1% was used for local anesthesia. A 6 French sheath was inserted into the right radial artery using standard Seldinger technique.  A 6 French sheath was inserted to the right common femoral vein.  I was unable to cannulate the coronary arteries from the radial approach and defaulted to the femoral approach with Judkins  catheters.  Isovue dye is used for the entirety of the case (120 cc total contrast the patient).  Retrograde ordered, ventricular apical pressures were recorded.  LVEDP was 20.    Ms. Bernette Mayers  has essentially normal coronary arteries.  She has moderate to severe LV dysfunction with wall motion consistent with "Takotsubo syndrome".  The radial sheath was removed and a TR band was placed.  The femoral sheath was removed and pressure was held.  She left lab in stable condition.  2D echo was ordered.  She will need guideline directed to medical therapy for LV dysfunction.  I suspect her LVEF will improve over the next 3 to 6 months. Nanetta Batty. MD, [facility] 03/14/2022 10:48 PM    DG Chest 2 View  Result Date: 03/14/2022 CLINICAL DATA:  Chest pain. EXAM: CHEST - 2 VIEW COMPARISON:  July 13, 2011 FINDINGS: The heart size and mediastinal contours are within normal limits. Both lungs are clear. Multilevel degenerative changes are seen within the thoracic spine. IMPRESSION: No active cardiopulmonary disease. Electronically Signed   By: Aram Candela M.D.   On: 03/14/2022 19:22    Cardiac Studies   Cath 03/14/22 Normal coronaries There is moderate to severe left ventricular systolic dysfunction. LV end diastolic pressure is mildly elevated. The left ventricular ejection fraction is 25-35% by visual estimate.  Echo 03/15/22  1. Left ventricular ejection fraction, by estimation, is 45 to 50%. The  left ventricle has mildly decreased function. The left ventricle  demonstrates regional wall motion abnormalities (see scoring  diagram/findings for description). Left ventricular  diastolic parameters are consistent with Grade I diastolic dysfunction  (impaired relaxation).   2. Right ventricular systolic function is normal. The right ventricular  size is normal.   3. The mitral valve is normal in structure. No evidence of mitral valve  regurgitation. No evidence of mitral stenosis.   4. The aortic valve  is normal in structure. Aortic valve regurgitation is  not visualized. No aortic stenosis is present.   5. The inferior vena cava is normal in size with greater than 50%  respiratory variability, suggesting right atrial pressure of 3 mmHg.   Patient Profile     61 y.o. female with PMH type 2 diabetes, hypertension, hyperlipidemia, obesity presenting with chest pain, ST elevations and elevated troponins and activated as code STEMI. Found to  have clean coronaries.  Assessment & Plan    STEMI without CAD Possible Takotsubo vs. Vasospasm Nonischemic cardiomyopathy -akinetic apex on LV gram, echo with EF 45-50% with dyskinetic apex -has been started on beta blocker, uptitrating  Type II diabetes -A1c 13.5 -prescribed insulin at home -consulted diabetes coordinator, appreciate assistance -titrating insulin today -was prescribed sitagliptin-metformin and insulin 70/30 40 units BID, was not taking for a time due to availability  Hypertriglyceridemia -suspect 2/2 type II diabetes being uncontrolled -No CAD, but on aspirin/statin for diabetes CV risk reduction. Continue atorvastatin and aspirin 81 mg -recheck TG as an outpatient, suspect DM control will improve TG -has been started on fenofibrate, follow up as outpatient to see if this can be stopped  Hypertension -home meds were HCTZ -stop HCTZ given elevated TG -titrating beta blocker as above -depending on blood pressure, start ARB or ARNI -with type II diabetes, will start SGLT2i in place of sitagliptin this admission once blood sugar level is improved  Appropriate to transfer to telemetry, order placed.  For questions or updates, please contact [facility] Please consult [email or web address] for contact info under        Signed, Jodelle Red, MD  03/15/2022, 5:31 PM

## 2022-03-15 NOTE — Progress Notes (Signed)
Echocardiogram 2D Echocardiogram has been performed.  Diane Lawson 03/15/2022, 10:22 AM

## 2022-03-15 NOTE — Progress Notes (Signed)
CARDIAC REHAB PHASE I   PRE:  Rate/Rhythm: 74 SR    BP: sitting 118/76    SaO2: 92 RA  MODE:  Ambulation: 370 ft   POST:  Rate/Rhythm: 82 SR    BP: sitting 115/70     SaO2: 95 RA  Pt ambulated independently. C/o dizziness after 200 ft, fearful of syncope as she has a history of syncope with stressful events. Rested then able to make it back to room.  Dizziness resolved with rest. BP stable however required 2 min to register.   Discussed with pt MI/takotsubo, restrictions, DM diet, exercise, and CRPII. Pt very receptive. HF navigator following. Will refer to [facility].  [phone]  Yves Dill BS, ACSM-CEP 03/15/2022 11:26 AM

## 2022-03-15 NOTE — Inpatient Diabetes Management (Addendum)
Inpatient Diabetes Program Recommendations  AACE/ADA: New Consensus Statement on Inpatient Glycemic Control (2015)  Target Ranges:  Prepandial:   less than 140 mg/dL      Peak postprandial:   less than 180 mg/dL (1-2 hours)      Critically ill patients:  140 - 180 mg/dL   Lab Results  Component Value Date   GLUCAP 305 (H) 03/15/2022   HGBA1C 13.5 (H) 03/14/2022    Review of Glycemic Control  Latest Reference Range & Units 03/14/22 21:08 03/14/22 23:21 03/15/22 04:19 03/15/22 06:59  Glucose-Capillary 70 - 99 mg/dL 431 (H) 354 (H) 373 (H) 305 (H)  (H): Data is abnormally high  Diabetes history: DM2 Outpatient Diabetes medications: 70/30 40 units bid, Current orders for Inpatient glycemic control: Novolog 0-15 units tid  Inpatient Diabetes Program Recommendations:   Received consult regarding diabetes management. -Add Semglee 30 units qd ( 0.2 units/kg x 136 kg = 27 units) -Add Novolog 5 units tid meal coverage if eats 50% -Add Novolog 0-5 units hs correction Will plan to speak with patient regarding A1c of 13.5 (average blood glucose 341 over the past 2-3 months) Ordered Living Well With Diabetes for review.  2:15 pm Spoke with patient and children @ bedside regarding diabetes management. Patient stopped taking her insulin due to cost. Patient uses vial/syringe 70/30 Novolin relion from [facility] to decrease cost. States she was on Trulicity in the past, but had to discontinue to labs. States she does not remember taking Janumet in the past. Patient used to see endocrinologist Dr. Buddy Duty in the past but has not seen him for a long time. 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 States she plans to go to cardiac rehab and encouraged patient to move as often as possible according to doctor's limitations. Reviewed basic nutrition plate method with patient. Patient does the cooking @  home and her son lives with her. Patient plans to review Living Well With Diabetes book.  2:57 pm Lab glucose increased to 412. Consider: -Increase Semglee to 50 units qd (give additional 20 units now to = 50 units for today -Increase Novolog meal coverage to 8 units if eats 50% -Add Novolog 0-5 units hs  Thank you, Nani Gasser. Maxfield Gildersleeve, RN, MSN, CDE  Diabetes Coordinator Inpatient Glycemic Control Team Team Pager [phone] (8am-5pm) 03/15/2022 9:32 AM

## 2022-03-15 NOTE — Progress Notes (Signed)
Echocardiogram 2D Echocardiogram has been performed.  Fidel Levy 03/15/2022, 10:23 AM

## 2022-03-16 ENCOUNTER — Telehealth (HOSPITAL_COMMUNITY): Payer: Self-pay | Admitting: Pharmacy Technician

## 2022-03-16 ENCOUNTER — Encounter (HOSPITAL_COMMUNITY): Payer: Self-pay | Admitting: Cardiovascular Disease

## 2022-03-16 ENCOUNTER — Other Ambulatory Visit (HOSPITAL_COMMUNITY): Payer: Self-pay

## 2022-03-16 DIAGNOSIS — E7800 Pure hypercholesterolemia, unspecified: Secondary | ICD-10-CM

## 2022-03-16 DIAGNOSIS — E1165 Type 2 diabetes mellitus with hyperglycemia: Secondary | ICD-10-CM | POA: Diagnosis not present

## 2022-03-16 DIAGNOSIS — I5181 Takotsubo syndrome: Secondary | ICD-10-CM | POA: Diagnosis not present

## 2022-03-16 DIAGNOSIS — E781 Pure hyperglyceridemia: Secondary | ICD-10-CM | POA: Diagnosis not present

## 2022-03-16 LAB — GLUCOSE, CAPILLARY
Glucose-Capillary: 304 mg/dL — ABNORMAL HIGH (ref 70–99)
Glucose-Capillary: 284 mg/dL — ABNORMAL HIGH (ref 70–99)
Glucose-Capillary: 283 mg/dL — ABNORMAL HIGH (ref 70–99)

## 2022-03-16 LAB — BASIC METABOLIC PANEL
Anion gap: 8 (ref 5–15)
BUN: 23 mg/dL — ABNORMAL HIGH (ref 6–20)
CO2: 26 mmol/L (ref 22–32)
Calcium: 8.5 mg/dL — ABNORMAL LOW (ref 8.9–10.3)
Chloride: 101 mmol/L (ref 98–111)
Creatinine, Ser: 1.13 mg/dL — ABNORMAL HIGH (ref 0.44–1.00)
GFR, Estimated: 56 mL/min — ABNORMAL LOW (ref >60)
Glucose, Bld: 305 mg/dL — ABNORMAL HIGH (ref 70–99)
Potassium: 3.4 mmol/L — ABNORMAL LOW (ref 3.5–5.1)
Sodium: 135 mmol/L (ref 135–145)

## 2022-03-16 LAB — LIPOPROTEIN A (LPA): Lipoprotein (a): 139.3 nmol/L — ABNORMAL HIGH (ref <75.0)

## 2022-03-16 MED ORDER — NOVOLIN 70/30 FLEXPEN (70-30) 100 UNIT/ML ~~LOC~~ SUPN
40.0000 [IU] | PEN_INJECTOR | Freq: Two times a day (BID) | SUBCUTANEOUS | 1 refills | Status: DC
  Filled 2022-03-16: qty 30, 38d supply, fill #0

## 2022-03-16 MED ORDER — FENOFIBRATE 160 MG PO TABS
160.0000 mg | ORAL_TABLET | Freq: Every day | ORAL | 2 refills | Status: DC
  Filled 2022-03-16 – 2022-04-15 (×2): qty 30, 30d supply, fill #0
  Filled 2022-05-16 – 2022-05-20 (×2): qty 30, 30d supply, fill #1

## 2022-03-16 MED ORDER — METOPROLOL SUCCINATE ER 50 MG PO TB24
50.0000 mg | ORAL_TABLET | Freq: Every day | ORAL | Status: DC
  Administered 2022-03-16: 50 mg via ORAL
  Filled 2022-03-16: qty 1

## 2022-03-16 MED ORDER — POTASSIUM CHLORIDE CRYS ER 20 MEQ PO TBCR
40.0000 meq | EXTENDED_RELEASE_TABLET | Freq: Once | ORAL | Status: AC
  Administered 2022-03-16: 40 meq via ORAL
  Filled 2022-03-16: qty 2

## 2022-03-16 MED ORDER — DAPAGLIFLOZIN PROPANEDIOL 10 MG PO TABS
10.0000 mg | ORAL_TABLET | Freq: Every day | ORAL | 3 refills | Status: DC
  Filled 2022-03-16 – 2022-04-15 (×2): qty 30, 30d supply, fill #0
  Filled 2022-05-16 – 2022-05-20 (×2): qty 30, 30d supply, fill #1
  Filled 2022-06-13: qty 30, 30d supply, fill #2

## 2022-03-16 MED ORDER — METOPROLOL SUCCINATE ER 50 MG PO TB24
50.0000 mg | ORAL_TABLET | Freq: Every day | ORAL | 5 refills | Status: DC
  Filled 2022-03-16 – 2022-04-15 (×2): qty 30, 30d supply, fill #0
  Filled 2022-05-16 – 2022-05-20 (×2): qty 30, 30d supply, fill #1
  Filled 2022-06-13: qty 30, 30d supply, fill #2
  Filled 2022-07-15: qty 30, 30d supply, fill #3
  Filled 2022-08-13: qty 30, 30d supply, fill #4

## 2022-03-16 MED ORDER — INSULIN SYRINGES (DISPOSABLE) U-100 1 ML MISC
0 refills | Status: DC
  Filled 2022-03-16: qty 200, fill #0
  Filled 2022-05-16 – 2022-07-15 (×3): qty 200, 100d supply, fill #0

## 2022-03-16 MED ORDER — NOVOLIN 70/30 RELION (70-30) 100 UNIT/ML ~~LOC~~ SUSP: 40.0000 [IU] | Freq: Two times a day (BID) | SUBCUTANEOUS | 1 refills | Status: AC

## 2022-03-16 MED ORDER — DAPAGLIFLOZIN PROPANEDIOL 10 MG PO TABS
10.0000 mg | ORAL_TABLET | Freq: Every day | ORAL | Status: DC
  Administered 2022-03-16: 10 mg via ORAL
  Filled 2022-03-16: qty 1

## 2022-03-16 MED ORDER — INSULIN SYRINGES (DISPOSABLE) U-100 1 ML MISC: 0 refills | Status: AC

## 2022-03-16 MED ORDER — INSULIN PEN NEEDLE 32G X 4 MM MISC
0 refills | Status: DC
  Filled 2022-03-16: qty 100, 30d supply, fill #0

## 2022-03-16 MED ORDER — ATORVASTATIN CALCIUM 80 MG PO TABS
80.0000 mg | ORAL_TABLET | Freq: Every day | ORAL | 3 refills | Status: DC
  Filled 2022-03-16 – 2022-04-15 (×2): qty 30, 30d supply, fill #0
  Filled 2022-05-16 – 2022-05-20 (×2): qty 30, 30d supply, fill #1
  Filled 2022-06-13: qty 30, 30d supply, fill #2

## 2022-03-16 MED ORDER — ASPIRIN 81 MG PO TBEC
81.0000 mg | DELAYED_RELEASE_TABLET | Freq: Every day | ORAL | 12 refills | Status: DC
  Filled 2022-03-16 – 2022-04-15 (×2): qty 30, 30d supply, fill #0
  Filled 2022-05-16 – 2022-05-20 (×2): qty 30, 30d supply, fill #1
  Filled 2022-06-13: qty 30, 30d supply, fill #2
  Filled 2022-07-15: qty 30, 30d supply, fill #3
  Filled 2022-08-13: qty 30, 30d supply, fill #4
  Filled 2022-09-06 (×2): qty 30, 30d supply, fill #5
  Filled 2022-10-06: qty 30, 30d supply, fill #6
  Filled 2022-11-05: qty 30, 30d supply, fill #7
  Filled 2022-12-07: qty 30, 30d supply, fill #8
  Filled 2023-01-30: qty 30, 30d supply, fill #9
  Filled 2023-02-26: qty 30, 30d supply, fill #10

## 2022-03-16 NOTE — Progress Notes (Signed)
Heart Failure Nurse Navigator Progress Note  PCP: Mila Palmer, MD PCP-Cardiologist: Joneen Boers., MD (NEW) Admission Diagnosis: Takotsubo CM Admitted from: home with adult children  Presentation:   Diane Lawson presented 10/8 with increased chest pressure. Troponins elevated, ST elevations noted. STEMI activated, LHC clean coronaries. Concern for Takotsubo cardiomyopathy. Pt resting comfortably in bed on room air, HOB 45. Pt very tearful during interview process. Endorses high stress job. Recent significant financial strife, now resolved d/t withdrawal from 401K to help with multiple private loans. Unable to afford medication x 3-6 months during this time. States husband passed 19 years ago 10/16, tearful when bringing up anniversary date. Pt lost her father 3 years ago who was a big supporter in her life. Pt states her mother lives nearby and she is worried about her since her father's passing. Feels like she is getting things back together, then had chest pain during her grandson's birthday party on Saturday. Was scared to come to hospital. Grateful for care given and support from staff.  Pt adult son lives with her. Pt drives reliable vehicle. 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 Endorses ready to care for self and will take steps toward improving care and mental health. Will call her work to discuss EACP counseling program.  Pt agreeable to [facility] clinic appt.   ECHO/ LVEF: 45-50%, Takotsubo cardiomyopathy.   Clinical Course:  Past Medical History:  Diagnosis Date   Diabetes mellitus without complication (HCC)    Hypertension      Social History   Socioeconomic History   Marital status: Widowed    Spouse name: Not on file   Number of children: 2   Years of education: Not on file   Highest education level: Some college, no degree  Occupational History   Occupation: Primary school teacher    Comment: [place]   Tobacco Use   Smoking status: Never   Smokeless tobacco: Never  Vaping Use   Vaping Use: Never used  Substance and Sexual Activity   Alcohol use: No   Drug use: No   Sexual activity: Not on file  Other Topics Concern   Not on file  Social History Narrative   Not on file   Social Determinants of Health   Financial Resource Strain: Medium Risk (03/16/2022)   Overall Financial Resource Strain (CARDIA)    Difficulty of Paying Living Expenses: Somewhat hard  Food Insecurity: No Food Insecurity (03/16/2022)   Hunger Vital Sign    Worried About Running Out of Food in the Last Year: Never true    Ran Out of Food in the Last Year: Never true  Transportation Needs: No Transportation Needs (03/16/2022)   PRAPARE - Administrator, Civil Service (Medical): No    Lack of Transportation (Non-Medical): No  Physical Activity: Not on file  Stress: Not on file  Social Connections: Not on file    High Risk Criteria for Readmission and/or Poor Patient Outcomes: Heart failure hospital admissions (last 6 months): 1  No Show rate: 11% Difficult social situation: yes Demonstrates medication adherence: NO Primary Language: English  Literacy level: able to read/write and comprehend. Wears glasses.   Education Assessment and Provision:  Detailed education and instructions provided on heart failure disease management including the following:  Signs and symptoms of Heart Failure When to call the physician Importance of daily weights Low sodium diet Fluid restriction Medication management Anticipated future follow-up appointments  Patient education given on each  of the above topics.  Patient acknowledges understanding via teach back method and acceptance of all instructions.  Education Materials:  "Living Better With Heart Failure" Booklet, HF zone tool, & Daily Weight Tracker Tool.  Patient has scale at home: yes Patient has pill box at home: yes    Barriers of Care:    -new HF dx -emotional stressors/coping -mediation compliance -financial stress  Considerations/Referrals:   Referral made to Heart Failure Pharmacist Stewardship: yes, appreciated Referral made to Heart Failure CSW/NCM TOC: yes, appreciated. Will see at [facility] Referral made to Heart & Vascular TOC clinic: yes, 10/23 @ 3pm per pt request. Has timing conflict with son (she drives him)   Items for Follow-up on DC/TOC: -optimize -coping -medication costs -continue HF education    Pricilla Holm, MSN, RN Heart Failure Nurse Navigator

## 2022-03-16 NOTE — Progress Notes (Addendum)
Heart Failure Patient Advocate Encounter   Received notification from [employer] that prior authorization for [place] is required.   PA submitted on CoverMyMeds Key [id number] Status is approved Copay card will lower to $0 per month   Kerby Nora, PharmD, BCPS Heart Failure Stewardship Pharmacist Phone [phone]  Please check [email or web address] for unit-specific pharmacist phone numbers

## 2022-03-16 NOTE — Progress Notes (Addendum)
Working with Lily Kocher PA-C today. Patient requested work note through 10/23 instead. Given troponin elevation/admission dx, this is completely reasonable. Evan addended the work note to reflect this change.

## 2022-03-16 NOTE — TOC Benefit Eligibility Note (Signed)
Patient Teacher, English as a foreign language completed.    The patient is currently admitted and upon discharge could be taking Humalog Pens.  The current 30 day co-pay is $138.55.   The patient is currently admitted and upon discharge could be taking Copy.  The current 30 day co-pay is $222.30.   The patient is currently admitted and upon discharge could be taking Novolog Pens.  Not Covered  The patient is currently admitted and upon discharge could be taking Lantus Pens.  Not Covered  The patient is insured through [place], [place] Patient Advocate Specialist [place] Patient Advocate Team Direct Number: [phone]  Fax: [phone]

## 2022-03-16 NOTE — Telephone Encounter (Signed)
Pharmacy Patient Advocate Encounter  Insurance verification completed.    The patient is insured through [employer]   The patient is currently admitted and ran test claims for the following: Lantus, Basaglar, Novolog, Humalog.  Copays and coinsurance results were relayed to Inpatient clinical team.

## 2022-03-16 NOTE — Progress Notes (Signed)
Rounding Note    Patient Name: Diane Lawson Date of Encounter: 03/16/2022  [facility] Cardiologist: Jodelle Red, MD   Subjective   Chest pressure resolved. Sugars improving. Receiving med education at the bedside.  Inpatient Medications    Scheduled Meds:  aspirin EC  81 mg Oral Daily   atorvastatin  80 mg Oral Daily   Chlorhexidine Gluconate Cloth  6 each Topical Daily   dapagliflozin propanediol  10 mg Oral Daily   [START ON 03/17/2022] enoxaparin (LOVENOX) injection  65 mg Subcutaneous Q24H   fenofibrate  160 mg Oral Daily   insulin aspart  0-15 Units Subcutaneous TID WC   insulin aspart  8 Units Subcutaneous TID WC   insulin glargine-yfgn  50 Units Subcutaneous Daily   metoprolol succinate  50 mg Oral Daily   sodium chloride flush  3 mL Intravenous Q12H   Continuous Infusions:  sodium chloride 15 mL/hr at 03/15/22 0900   sodium chloride     PRN Meds: sodium chloride, acetaminophen, morphine injection, nitroGLYCERIN, ondansetron (ZOFRAN) IV, sodium chloride flush   Vital Signs    Vitals:   03/15/22 2353 03/16/22 0448 03/16/22 0814 03/16/22 1130  BP: (!) 99/44 (!) 109/58 130/67 121/69  Pulse: 72 67 72 71  Resp: 18 17 17 19   Temp: 98.9 F (37.2 C) 98.8 F (37.1 C) 98.8 F (37.1 C) 98 F (36.7 C)  TempSrc: Oral Oral Oral Oral  SpO2: 96% 92% 94% 94%  Weight:      Height:        Intake/Output Summary (Last 24 hours) at 03/16/2022 1349 Last data filed at 03/16/2022 0732 Gross per 24 hour  Intake 360 ml  Output --  Net 360 ml      03/14/2022   11:20 PM 03/14/2022    9:05 PM 11/06/2020    9:57 AM  Last 3 Weights  Weight (lbs) 299 lb 13.2 oz 300 lb 320 lb  Weight (kg) 136 kg 136.079 kg 145.151 kg      Telemetry    SR - Personally Reviewed  ECG    Sinus with resolving ST changes - Personally Reviewed  Physical Exam   GEN: Well nourished, well developed in no acute distress NECK: No JVD CARDIAC: regular rhythm,  normal S1 and S2, no rubs or gallops. No murmur. VASCULAR: Radial pulses 2+ bilaterally.  RESPIRATORY:  Clear to auscultation without rales, wheezing or rhonchi  ABDOMEN: Soft, non-tender, non-distended MUSCULOSKELETAL:  Moves all 4 limbs independently SKIN: Warm and dry, no edema NEUROLOGIC:  No focal neuro deficits noted. PSYCHIATRIC:  Normal affect    Labs    High Sensitivity Troponin:   Recent Labs  Lab 03/14/22 1812 03/14/22 2025  TROPONINIHS 7,226* 7,943*     Chemistry Recent Labs  Lab 03/14/22 1812 03/15/22 0104 03/15/22 1331 03/16/22 0959  NA 135 136  --  135  K 4.1 3.8  --  3.4*  CL 97* 94*  --  101  CO2 24 26  --  26  GLUCOSE 413* 354* 412* 305*  BUN 13 14  --  23*  CREATININE 1.19* 1.34*  --  1.13*  CALCIUM 9.5 9.6  --  8.5*  GFRNONAA 53* 46*  --  56*  ANIONGAP 14 16*  --  8    Lipids  Recent Labs  Lab 03/14/22 2025  CHOL 437*  TRIG 540*  HDL 41  LDLCALC UNABLE TO CALCULATE IF TRIGLYCERIDE OVER 400 mg/dL  CHOLHDL 05/14/22    Hematology  Recent Labs  Lab 03/14/22 1812 03/15/22 0104  WBC 11.2* 9.4  RBC 5.55* 5.31*  HGB 16.3* 15.5*  HCT 47.2* 45.3  MCV 85.0 85.3  MCH 29.4 29.2  MCHC 34.5 34.2  RDW 13.1 13.2  PLT 206 181   Thyroid  Recent Labs  Lab 03/14/22 2025  TSH 1.923    BNP Recent Labs  Lab 03/15/22 0104  BNP 799.6*    DDimer No results for input(s): "DDIMER" in the last 168 hours.   Radiology    ECHOCARDIOGRAM COMPLETE  Result Date: 03/15/2022    ECHOCARDIOGRAM REPORT   Patient Name:   Diane Lawson Date of Exam: 03/15/2022 Medical Rec #:  [id number]           Height:       67.0 in Accession #:    [id number]          Weight:       299.8 lb Date of Birth:  April 03, 1961           BSA:          2.402 m Patient Age:    61 years            BP:           122/89 mmHg Patient Gender: F                   HR:           90 bpm. Exam Location:  Inpatient Procedure: 2D Echo, Cardiac Doppler, Color Doppler and Intracardiac             Opacification Agent Indications:    Acute myocardial infarction, unspecified I21.9  History:        Patient has no prior history of Echocardiogram examinations.                 Risk Factors:Hypertension and Diabetes.  Sonographer:    Eulah Pont RDCS Referring Phys: [id number] Freddy Finner IMPRESSIONS  1. Left ventricular ejection fraction, by estimation, is 45 to 50%. The left ventricle has mildly decreased function. The left ventricle demonstrates regional wall motion abnormalities (see scoring diagram/findings for description). Left ventricular diastolic parameters are consistent with Grade I diastolic dysfunction (impaired relaxation).  2. Right ventricular systolic function is normal. The right ventricular size is normal.  3. The mitral valve is normal in structure. No evidence of mitral valve regurgitation. No evidence of mitral stenosis.  4. The aortic valve is normal in structure. Aortic valve regurgitation is not visualized. No aortic stenosis is present.  5. The inferior vena cava is normal in size with greater than 50% respiratory variability, suggesting right atrial pressure of 3 mmHg. FINDINGS  Left Ventricle: Left ventricular ejection fraction, by estimation, is 45 to 50%. The left ventricle has mildly decreased function. The left ventricle demonstrates regional wall motion abnormalities. Definity contrast agent was given IV to delineate the left ventricular endocardial borders. The left ventricular internal cavity size was normal in size. There is no left ventricular hypertrophy. Left ventricular diastolic parameters are consistent with Grade I diastolic dysfunction (impaired relaxation).  LV Wall Scoring: The entire apex is dyskinetic. Right Ventricle: The right ventricular size is normal. No increase in right ventricular wall thickness. Right ventricular systolic function is normal. Left Atrium: Left atrial size was normal in size. Right Atrium: Right atrial size was normal in size. Pericardium:  There is no evidence of pericardial effusion. Mitral Valve: The mitral valve is normal in structure.  No evidence of mitral valve regurgitation. No evidence of mitral valve stenosis. Tricuspid Valve: The tricuspid valve is normal in structure. Tricuspid valve regurgitation is not demonstrated. No evidence of tricuspid stenosis. Aortic Valve: The aortic valve is normal in structure. Aortic valve regurgitation is not visualized. No aortic stenosis is present. Pulmonic Valve: The pulmonic valve was normal in structure. Pulmonic valve regurgitation is not visualized. No evidence of pulmonic stenosis. Aorta: The aortic root is normal in size and structure. Venous: The inferior vena cava is normal in size with greater than 50% respiratory variability, suggesting right atrial pressure of 3 mmHg. IAS/Shunts: No atrial level shunt detected by color flow Doppler.  LEFT VENTRICLE PLAX 2D LVIDd:         4.30 cm      Diastology LVIDs:         2.90 cm      LV e' medial:    5.03 cm/s LV PW:         1.00 cm      LV E/e' medial:  14.0 LV IVS:        1.00 cm      LV e' lateral:   6.31 cm/s LVOT diam:     2.10 cm      LV E/e' lateral: 11.2 LV SV:         79 LV SV Index:   33 LVOT Area:     3.46 cm  LV Volumes (MOD) LV vol d, MOD A2C: 88.1 ml LV vol d, MOD A4C: 120.0 ml LV vol s, MOD A2C: 43.4 ml LV vol s, MOD A4C: 64.0 ml LV SV MOD A2C:     44.7 ml LV SV MOD A4C:     120.0 ml LV SV MOD BP:      54.4 ml RIGHT VENTRICLE RV S prime:     11.80 cm/s TAPSE (M-mode): 1.8 cm LEFT ATRIUM             Index        RIGHT ATRIUM           Index LA diam:        3.90 cm 1.62 cm/m   RA Area:     13.70 cm LA Vol (A2C):   57.3 ml 23.86 ml/m  RA Volume:   31.50 ml  13.11 ml/m LA Vol (A4C):   61.9 ml 25.77 ml/m LA Biplane Vol: 60.3 ml 25.10 ml/m  AORTIC VALVE LVOT Vmax:   103.00 cm/s LVOT Vmean:  78.100 cm/s LVOT VTI:    0.227 m  AORTA Ao Root diam: 3.30 cm Ao Asc diam:  3.30 cm MITRAL VALVE MV Area (PHT): 3.99 cm    SHUNTS MV Decel Time: 190 msec     Systemic VTI:  0.23 m MV E velocity: 70.60 cm/s  Systemic Diam: 2.10 cm MV A velocity: 66.50 cm/s MV E/A ratio:  1.06 Candee Furbish MD Electronically signed by Candee Furbish MD Signature Date/Time: 03/15/2022/11:15:08 AM    Final    CARDIAC CATHETERIZATION  Result Date: 03/14/2022 Images from the original result were not included.   There is moderate to severe left ventricular systolic dysfunction.   LV end diastolic pressure is mildly elevated.   The left ventricular ejection fraction is 25-35% by visual estimate. Caylea Lawson is a 62 y.o. female  [id number] LOCATION:  FACILITY: [facility] PHYSICIAN: Quay Burow, M.D. 15-Feb-1961 DATE OF PROCEDURE:  03/14/2022 DATE OF DISCHARGE: CARDIAC CATHETERIZATION History obtained from chart review.  Ms. [name] is  a 61 year old severely overweight widowed Caucasian female with multiple cardiac risk factors including treated hypertension, diabetes and hyperlipidemia.  She does not smoke.  She is adopted.  She had chest pain yesterday with back pain and shoulder pain as well for at least a half an hour sustained and after that has had off-and-on chest pain for 24 hours.  She presented to the emergency room where her troponins were 7000 and her EKG showed diffuse ST segment elevation.  She was pain-free.  Because of her EKG changes and positive troponins she was brought to the Cath Lab for angiography potential intervention. PROCEDURE DESCRIPTION: The patient was brought to the second floor [facility] cath lab in the postabsorptive state.  She was not premedicated .  Her right wrist and groin Were prepped and shaved in usual sterile fashion. Xylocaine 1% was used for local anesthesia. A 6 French sheath was inserted into the right radial artery using standard Seldinger technique.  A 6 French sheath was inserted to the right common femoral vein.  I was unable to cannulate the coronary arteries from the radial approach and defaulted to the femoral approach with Judkins  catheters.  Isovue dye is used for the entirety of the case (120 cc total contrast the patient).  Retrograde ordered, ventricular apical pressures were recorded.  LVEDP was 20.    Ms. Bernette Mayers  has essentially normal coronary arteries.  She has moderate to severe LV dysfunction with wall motion consistent with "Takotsubo syndrome".  The radial sheath was removed and a TR band was placed.  The femoral sheath was removed and pressure was held.  She left lab in stable condition.  2D echo was ordered.  She will need guideline directed to medical therapy for LV dysfunction.  I suspect her LVEF will improve over the next 3 to 6 months. Nanetta Batty. MD, [facility] 03/14/2022 10:48 PM    DG Chest 2 View  Result Date: 03/14/2022 CLINICAL DATA:  Chest pain. EXAM: CHEST - 2 VIEW COMPARISON:  July 13, 2011 FINDINGS: The heart size and mediastinal contours are within normal limits. Both lungs are clear. Multilevel degenerative changes are seen within the thoracic spine. IMPRESSION: No active cardiopulmonary disease. Electronically Signed   By: Aram Candela M.D.   On: 03/14/2022 19:22    Cardiac Studies   Cath 03/14/22 Normal coronaries There is moderate to severe left ventricular systolic dysfunction. LV end diastolic pressure is mildly elevated. The left ventricular ejection fraction is 25-35% by visual estimate.  Echo 03/15/22  1. Left ventricular ejection fraction, by estimation, is 45 to 50%. The  left ventricle has mildly decreased function. The left ventricle  demonstrates regional wall motion abnormalities (see scoring  diagram/findings for description). Left ventricular  diastolic parameters are consistent with Grade I diastolic dysfunction  (impaired relaxation).   2. Right ventricular systolic function is normal. The right ventricular  size is normal.   3. The mitral valve is normal in structure. No evidence of mitral valve  regurgitation. No evidence of mitral stenosis.   4. The aortic valve  is normal in structure. Aortic valve regurgitation is  not visualized. No aortic stenosis is present.   5. The inferior vena cava is normal in size with greater than 50%  respiratory variability, suggesting right atrial pressure of 3 mmHg.   Patient Profile     61 y.o. female with PMH type 2 diabetes, hypertension, hyperlipidemia, obesity presenting with chest pain, ST elevations and elevated troponins and activated as code  STEMI. Found to have clean coronaries.  Assessment & Plan    STEMI without CAD Possible Takotsubo vs. Vasospasm Nonischemic cardiomyopathy -akinetic apex on LV gram, echo with EF 45-50% with dyskinetic apex -has been started on beta blocker, consolidating to metoprolol succinate today -adding SGLT2i today -no BP room today for ARB/ARNI  Type II diabetes -A1c 13.5 -prescribed insulin at home, was not taking recently -consulted diabetes coordinator, appreciate assistance -titrating insulin. Will receive 50 units semglee today, on 6 units aspart with meals plus sliding scale -was prescribed sitagliptin-metformin and insulin 70/30 40 units BID, was not taking for a time due to availability -adding Farxiga, prior auth approved  Hypertriglyceridemia Hypercholesterolemia Elevated Lp(a) -suspect elevated TG 2/2 type II diabetes being uncontrolled -No CAD, but on aspirin/statin for diabetes CV risk reduction. Continue atorvastatin and aspirin 81 mg. Direct LDL was 292. Recheck as outpatient -lp(a) elevated at 139 -recheck TG as an outpatient, suspect DM control will improve TG -has been started on fenofibrate, follow up as outpatient to see if this can be stopped if TG improved  Hypertension -home meds were HCTZ -stop HCTZ given elevated TG -consolidating metoprolol today -would like to start ARB or ARNI, but no BP room today -with type II diabetes, will start SGLT2i in place of sitagliptin  Plan for discharge today  For questions or updates, please contact  [facility] Please consult [email or web address] for contact info under        Signed, Jodelle Red, MD  03/16/2022, 1:49 PM

## 2022-03-16 NOTE — Progress Notes (Addendum)
Heart Failure Stewardship Pharmacist Progress Note   PCP: Jonathon Jordan, MD PCP-Cardiologist: Buford Dresser, MD    HPI:  61 yo F with PMH of HTN, HLD, obesity, and T2DM.  Presented to the ED on 10/8 with chest pain. In the ED, found to have elevated troponins with ST elevations in multiple leads. STEMI activated. Taken to cath lab and found to have clean coronaries but concern for Takotsubo cardiomyopathy. Reports multiple recent stressors. CXR without cardiopulmonary disease. ECHO on 10/9 showed LVEF 45-50%, regional wall motion abnormalities, G1DD, RV normal.   Current HF Medications: Beta Blocker: metoprolol XL 50 mg daily SGLT2i: Farxiga 10 mg daily  Prior to admission HF Medications: None - out of medications x 4-6 months  Pertinent Lab Values: As of 10/9: Serum creatinine 1.34, BUN 14, Potassium 3.8, Sodium 136, BNP 799.6, A1c 13.5   Vital Signs: Weight: 299 lbs (admission weight: 299 lbs) Blood pressure: 110-130/60s  Heart rate: 70-90s   Medication Assistance / Insurance Benefits Check: Does the patient have prescription insurance?  Yes Type of insurance plan: Art [employer]  Outpatient Pharmacy:  Prior to admission outpatient pharmacy: [facility] Is the patient willing to use [facility] at discharge? Yes Is the patient willing to transition their outpatient pharmacy to utilize a [facility] outpatient pharmacy?   Pending   Assessment: 1. 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 NYHA class II symptoms. - Not volume overloaded on exam, no indication for diuretics - Agree with transitioning to metoprolol XL 25 mg daily - BP limits ARB/ARNI or MRA - Continue Farxiga 10 mg daily, will complete prior authorization today   Plan: 1) Medication changes recommended at this time: - Agree with changes  2) Patient assistance: - Entresto copay $60, copay card lowers to $10 per fill - Farxiga/Jardiance needs prior authorization - will complete  today - Copay card for Farxiga lowers to $0 per month - Copay card for Jardiance lowers to $10 per month  3)  Education  - Patient has been educated on current HF medications and potential additions to HF medication regimen - Patient verbalizes understanding that over the next few months, these medication doses may change and more medications may be added to optimize HF regimen - Patient has been educated on basic disease state pathophysiology and goals of therapy   Kerby Nora, PharmD, BCPS Heart Failure Stewardship Pharmacist Phone [phone]

## 2022-03-16 NOTE — Inpatient Diabetes Management (Addendum)
Inpatient Diabetes Program Recommendations  AACE/ADA: New Consensus Statement on Inpatient Glycemic Control (2015)  Target Ranges:  Prepandial:   less than 140 mg/dL      Peak postprandial:   less than 180 mg/dL (1-2 hours)      Critically ill patients:  140 - 180 mg/dL   Lab Results  Component Value Date   GLUCAP 284 (H) 03/16/2022   HGBA1C 13.5 (H) 03/14/2022    Latest Reference Range & Units 03/15/22 06:59 03/15/22 11:50 03/15/22 12:13 03/15/22 16:10 03/15/22 21:33 03/16/22 06:33 03/16/22 11:24  Glucose-Capillary 70 - 99 mg/dL 305 (H) 414 (H) 424 (H) 324 (H) 379 (H) 304 (H) 284 (H)  (H): Data is abnormally high  Diabetes history: DM2 Outpatient Diabetes medications: 70/30 40 units bid Current orders for Inpatient glycemic control: Semglee 50 units (increased from 30 units this am), Novolog 8 units tid meal coverage, Novolog 0-15 units tid  Inpatient Diabetes Program Recommendations:   Please consider: -Increase Novolog meal coverage to 10 units tid if eats 50% -Add hs Novolog correction 0-5 units  Checking on prices of insulin options per request to prepare for discharge. Lantus not covered, basaglar copay is $222.30, Novolog not covered, Humalog copay is $138.55 So cheapest insulin option is Novolin Relion 70/30 from [facility] that patient has been taking. Spoke with patient and patient prefers to stay on vial and syringe. 70/30 40 units bid would = 56 units basal + 24 units meal coverage Novolin Relion vial [id number] Syringes [id number]  Thank you, Nani Gasser. Mechille Varghese, RN, MSN, CDE  Diabetes Coordinator Inpatient Glycemic Control Team Team Pager [phone] (8am-5pm) 03/16/2022 11:39 AM

## 2022-03-16 NOTE — Progress Notes (Signed)
Seen pt for ambulation, pt declined due to feeling tired. Reviewed stress relieving techniques.  [phone]

## 2022-03-16 NOTE — Progress Notes (Signed)
Patient discharging home with support from daughter. IV removed without complications. Tele removed and CCMD notified. Discharge instructions given and medication administration discussed. All questions answered. TOC brought meds and awaiting doctors note. Waiting for ride.  [place] C Lamond Glantz

## 2022-03-16 NOTE — Discharge Summary (Signed)
Discharge Summary    Patient ID: Diane Lawson MRN: [id number]; DOB: 1960/08/06  Admit date: 03/14/2022 Discharge date: 03/16/2022  PCP:  Jonathon Jordan, [place] Providers Cardiologist:  Buford Dresser, MD        Discharge Diagnoses    Principal Problem:   Takotsubo cardiomyopathy Active Problems:   STEMI (ST elevation myocardial infarction) ([place])   Diabetes mellitus ([place])   Obesity   Hypertriglyceridemia    Diagnostic Studies/Procedures    03/14/2022 Left Heart Catheterization  IMPRESSION: Diane Lawson  has essentially normal coronary arteries.  She has moderate to severe LV dysfunction with wall motion consistent with "Takotsubo syndrome".  The radial sheath was removed and a TR band was placed.  The femoral sheath was removed and pressure was held.  She left lab in stable condition.  2D echo was ordered.  She will need guideline directed to medical therapy for LV dysfunction.  I suspect her LVEF will improve over the next 3 to 6 months.   03/15/2022 TTE  IMPRESSIONS     1. Left ventricular ejection fraction, by estimation, is 45 to 50%. The  left ventricle has mildly decreased function. The left ventricle  demonstrates regional wall motion abnormalities (see scoring  diagram/findings for description). Left ventricular  diastolic parameters are consistent with Grade I diastolic dysfunction  (impaired relaxation).   2. Right ventricular systolic function is normal. The right ventricular  size is normal.   3. The mitral valve is normal in structure. No evidence of mitral valve  regurgitation. No evidence of mitral stenosis.   4. The aortic valve is normal in structure. Aortic valve regurgitation is  not visualized. No aortic stenosis is present.   5. The inferior vena cava is normal in size with greater than 50%  respiratory variability, suggesting right atrial pressure of 3 mmHg.   FINDINGS   Left Ventricle: Left ventricular  ejection fraction, by estimation, is 45  to 50%. The left ventricle has mildly decreased function. The left  ventricle demonstrates regional wall motion abnormalities. Definity  contrast agent was given IV to delineate the  left ventricular endocardial borders. The left ventricular internal cavity  size was normal in size. There is no left ventricular hypertrophy. Left  ventricular diastolic parameters are consistent with Grade I diastolic  dysfunction (impaired relaxation).      LV Wall Scoring:  The entire apex is dyskinetic.   Right Ventricle: The right ventricular size is normal. No increase in  right ventricular wall thickness. Right ventricular systolic function is  normal.   Left Atrium: Left atrial size was normal in size.   Right Atrium: Right atrial size was normal in size.   Pericardium: There is no evidence of pericardial effusion.   Mitral Valve: The mitral valve is normal in structure. No evidence of  mitral valve regurgitation. No evidence of mitral valve stenosis.   Tricuspid Valve: The tricuspid valve is normal in structure. Tricuspid  valve regurgitation is not demonstrated. No evidence of tricuspid  stenosis.   Aortic Valve: The aortic valve is normal in structure. Aortic valve  regurgitation is not visualized. No aortic stenosis is present.   Pulmonic Valve: The pulmonic valve was normal in structure. Pulmonic valve  regurgitation is not visualized. No evidence of pulmonic stenosis.   Aorta: The aortic root is normal in size and structure.   Venous: The inferior vena cava is normal in size with greater than 50%  respiratory variability, suggesting right atrial pressure of  3 mmHg.   IAS/Shunts: No atrial level shunt detected by color flow Doppler.    _____________   History of Present Illness     Diane Lawson is a 61 y.o. female with medical history of insulin dependent type II diabetes, hypertension, hyperlipidemia, obesity. Patient presented  to the ED for evaluation on 10/8. On 10/7, she developed acute onset neck/left shoulder pain that progressed to involved her entire chest. These symptoms began around 5:30 that evening shortly after she had been at a family member's birthday party. Pain remained severe for 30 minutes but slowly improved to 5/10. Upon awakening on 10/8, patient continued to have chest pain. She denied experiencing shortness of breath, diaphoresis, nausea/vomiting.   Hospital Course   STEMI without CAD HFmrEF HTN  In the ED, patient was found with elevated troponin of 7226, trended to 7943 with ST elevation in multiple leads prompting STEMI activation. She received 384m ASA with 4000 units of heparin and was taken to the cath lab. LHC was without coronary artery disease but apical akinesis was observed on ventriculogram. Patient was initiated on Metoprolol Tartrate 12.5102mBID, which was titrated to 2588mID. A complete thoracic echocardiogram was completed on 10/09 and was notable for LVEF 45-50% and a dyskinetic left ventricle. Because of notably elevated triglycerides, patient's home HCTZ was discontinued. She maintained normal blood pressure and at discharge, patient's Metoprolol was consolidated to metoprolol succinate 28m43m. She will continue on Farxiga which was initiated this admission.   Diabetes Type II  Patient is an insulin dependent type II diabetic and reported recently missing administration of insulin secondary to availability and affordability issues. A1c found at 13.5, triglycerides 540. Diabetes coordination was consulted and provided assistance in planning an affordable antihyperglycemic regimen. FarxWilder Gladetiated in place of Sitagliptin and patient will be discharged on Novolin Relion 70/30 40 units BID.  Hyperlipidemia Hypertriglyceridemia  Patient with LDL of 292, triglycerides 540. Suspect that [facility] are [facility] Continue atorvastatin and aspirin 81mg78mntinue  fenofibrate and reassess ongoing need in outpatient setting.      Did the patient have an acute coronary syndrome (MI, NSTEMI, STEMI, etc) this admission?:  No (Takotsubo cardiomyopathy suspected, no CAD).                           Did the patient have a percutaneous coronary intervention (stent / angioplasty)?:  No.        The patient will be scheduled for a TOC follow up appointment on 10/23. _____________  Discharge Vitals Blood pressure 121/69, pulse 71, temperature 98 F (36.7 C), temperature source Oral, resp. rate 19, height 5' 7"  (1.702 m), weight 136 kg, last menstrual period 10/24/2013, SpO2 94 %.  Filed Weights   03/14/22 2105 03/14/22 2320  Weight: 136.1 kg 136 kg    Labs & Radiologic Studies    CBC Recent Labs    03/14/22 1812 03/15/22 0104  WBC 11.2* 9.4  HGB 16.3* 15.5*  HCT 47.2* 45.3  MCV 85.0 85.3  PLT 206 181  751sic Metabolic Panel Recent Labs    03/15/22 0104 03/15/22 1331 03/16/22 0959  NA 136  --  135  K 3.8  --  3.4*  CL 94*  --  101  CO2 26  --  26  GLUCOSE 354* 412* 305*  BUN 14  --  23*  CREATININE 1.34*  --  1.13*  CALCIUM 9.6  --  8.5*   Liver  Function Tests No results for input(s): "AST", "ALT", "ALKPHOS", "BILITOT", "PROT", "ALBUMIN" in the last 72 hours. No results for input(s): "LIPASE", "AMYLASE" in the last 72 hours. High Sensitivity Troponin:   Recent Labs  Lab 03/14/22 1812 03/14/22 2025  TROPONINIHS 7,226* 7,943*    BNP Invalid input(s): "POCBNP" D-Dimer No results for input(s): "DDIMER" in the last 72 hours. Hemoglobin A1C Recent Labs    03/14/22 2101  HGBA1C 13.5*   Fasting Lipid Panel Recent Labs    03/14/22 2025  CHOL 437*  HDL 41  LDLCALC UNABLE TO CALCULATE IF TRIGLYCERIDE OVER 400 mg/dL  TRIG 540*  CHOLHDL 10.7  LDLDIRECT 292*   Thyroid Function Tests Recent Labs    03/14/22 2025  TSH 1.923   _____________  ECHOCARDIOGRAM COMPLETE  Result Date: 03/15/2022    ECHOCARDIOGRAM REPORT    Patient Name:   Diane Lawson Date of Exam: 03/15/2022 Medical Rec #:  [id number]           Height:       67.0 in Accession #:    [id number]          Weight:       299.8 lb Date of Birth:  09/06/1960           BSA:          2.402 m Patient Age:    40 years            BP:           122/89 mmHg Patient Gender: F                   HR:           90 bpm. Exam Location:  Inpatient Procedure: 2D Echo, Cardiac Doppler, Color Doppler and Intracardiac            Opacification Agent Indications:    Acute myocardial infarction, unspecified I21.9  History:        Patient has no prior history of Echocardiogram examinations.                 Risk Factors:Hypertension and Diabetes.  Sonographer:    Bernadene Person RDCS Referring Phys: [id number] [place]  1. Left ventricular ejection fraction, by estimation, is 45 to 50%. The left ventricle has mildly decreased function. The left ventricle demonstrates regional wall motion abnormalities (see scoring diagram/findings for description). Left ventricular diastolic parameters are consistent with Grade I diastolic dysfunction (impaired relaxation).  2. Right ventricular systolic function is normal. The right ventricular size is normal.  3. The mitral valve is normal in structure. No evidence of mitral valve regurgitation. No evidence of mitral stenosis.  4. The aortic valve is normal in structure. Aortic valve regurgitation is not visualized. No aortic stenosis is present.  5. The inferior vena cava is normal in size with greater than 50% respiratory variability, suggesting right atrial pressure of 3 mmHg. FINDINGS  Left Ventricle: Left ventricular ejection fraction, by estimation, is 45 to 50%. The left ventricle has mildly decreased function. The left ventricle demonstrates regional wall motion abnormalities. Definity contrast agent was given IV to delineate the left ventricular endocardial borders. The left ventricular internal cavity size was normal in size. There  is no left ventricular hypertrophy. Left ventricular diastolic parameters are consistent with Grade I diastolic dysfunction (impaired relaxation).  LV Wall Scoring: The entire apex is dyskinetic. Right Ventricle: The right ventricular size is normal. No increase in right ventricular wall thickness.  Right ventricular systolic function is normal. Left Atrium: Left atrial size was normal in size. Right Atrium: Right atrial size was normal in size. Pericardium: There is no evidence of pericardial effusion. Mitral Valve: The mitral valve is normal in structure. No evidence of mitral valve regurgitation. No evidence of mitral valve stenosis. Tricuspid Valve: The tricuspid valve is normal in structure. Tricuspid valve regurgitation is not demonstrated. No evidence of tricuspid stenosis. Aortic Valve: The aortic valve is normal in structure. Aortic valve regurgitation is not visualized. No aortic stenosis is present. Pulmonic Valve: The pulmonic valve was normal in structure. Pulmonic valve regurgitation is not visualized. No evidence of pulmonic stenosis. Aorta: The aortic root is normal in size and structure. Venous: The inferior vena cava is normal in size with greater than 50% respiratory variability, suggesting right atrial pressure of 3 mmHg. IAS/Shunts: No atrial level shunt detected by color flow Doppler.  LEFT VENTRICLE PLAX 2D LVIDd:         4.30 cm      Diastology LVIDs:         2.90 cm      LV e' medial:    5.03 cm/s LV PW:         1.00 cm      LV E/e' medial:  14.0 LV IVS:        1.00 cm      LV e' lateral:   6.31 cm/s LVOT diam:     2.10 cm      LV E/e' lateral: 11.2 LV SV:         79 LV SV Index:   33 LVOT Area:     3.46 cm  LV Volumes (MOD) LV vol d, MOD A2C: 88.1 ml LV vol d, MOD A4C: 120.0 ml LV vol s, MOD A2C: 43.4 ml LV vol s, MOD A4C: 64.0 ml LV SV MOD A2C:     44.7 ml LV SV MOD A4C:     120.0 ml LV SV MOD BP:      54.4 ml RIGHT VENTRICLE RV S prime:     11.80 cm/s TAPSE (M-mode): 1.8 cm LEFT ATRIUM              Index        RIGHT ATRIUM           Index LA diam:        3.90 cm 1.62 cm/m   RA Area:     13.70 cm LA Vol (A2C):   57.3 ml 23.86 ml/m  RA Volume:   31.50 ml  13.11 ml/m LA Vol (A4C):   61.9 ml 25.77 ml/m LA Biplane Vol: 60.3 ml 25.10 ml/m  AORTIC VALVE LVOT Vmax:   103.00 cm/s LVOT Vmean:  78.100 cm/s LVOT VTI:    0.227 m  AORTA Ao Root diam: 3.30 cm Ao Asc diam:  3.30 cm MITRAL VALVE MV Area (PHT): 3.99 cm    SHUNTS MV Decel Time: 190 msec    Systemic VTI:  0.23 m MV E velocity: 70.60 cm/s  Systemic Diam: 2.10 cm MV A velocity: 66.50 cm/s MV E/A ratio:  1.06 Candee Furbish MD Electronically signed by Candee Furbish MD Signature Date/Time: 03/15/2022/11:15:08 AM    Final    CARDIAC CATHETERIZATION  Result Date: 03/14/2022 Images from the original result were not included.   There is moderate to severe left ventricular systolic dysfunction.   LV end diastolic pressure is mildly elevated.   The left ventricular ejection fraction is  25-35% by visual estimate. Diane Lawson is a 61 y.o. female  [id number] LOCATION:  FACILITY: [facility] PHYSICIAN: Quay Burow, M.D. 03-29-1961 DATE OF PROCEDURE:  03/14/2022 DATE OF DISCHARGE: CARDIAC CATHETERIZATION History obtained from chart review.  Diane Lawson is a 61 year old severely overweight widowed Caucasian female with multiple cardiac risk factors including treated hypertension, diabetes and hyperlipidemia.  She does not smoke.  She is adopted.  She had chest pain yesterday with back pain and shoulder pain as well for at least a half an hour sustained and after that has had off-and-on chest pain for 24 hours.  She presented to the emergency room where her troponins were 7000 and her EKG showed diffuse ST segment elevation.  She was pain-free.  Because of her EKG changes and positive troponins she was brought to the Cath Lab for angiography potential intervention. PROCEDURE DESCRIPTION: The patient was brought to the second floor [facility] cath lab in the  postabsorptive state.  She was not premedicated .  Her right wrist and groin Were prepped and shaved in usual sterile fashion. Xylocaine 1% was used for local anesthesia. A 6 French sheath was inserted into the right radial artery using standard Seldinger technique.  A 6 French sheath was inserted to the right common femoral vein.  I was unable to cannulate the coronary arteries from the radial approach and defaulted to the femoral approach with Judkins catheters.  Isovue dye is used for the entirety of the case (120 cc total contrast the patient).  Retrograde ordered, ventricular apical pressures were recorded.  LVEDP was 20.    Diane Lawson  has essentially normal coronary arteries.  She has moderate to severe LV dysfunction with wall motion consistent with "Takotsubo syndrome".  The radial sheath was removed and a TR band was placed.  The femoral sheath was removed and pressure was held.  She left lab in stable condition.  2D echo was ordered.  She will need guideline directed to medical therapy for LV dysfunction.  I suspect her LVEF will improve over the next 3 to 6 months. Quay Burow. MD, [facility] 03/14/2022 10:48 PM    DG Chest 2 View  Result Date: 03/14/2022 CLINICAL DATA:  Chest pain. EXAM: CHEST - 2 VIEW COMPARISON:  July 13, 2011 FINDINGS: The heart size and mediastinal contours are within normal limits. Both lungs are clear. Multilevel degenerative changes are seen within the thoracic spine. IMPRESSION: No active cardiopulmonary disease. Electronically Signed   By: Virgina Norfolk M.D.   On: 03/14/2022 19:22   Disposition   Pt is being discharged home today in good condition.  Follow-up Plans & Appointments     Follow-up Information     [facility] Follow up.   Specialty: Cardiology Why: Monday, October 23 @ 3pm for [place] clinic appointment within [place]. Bring all medications with you. FREE valet parking at [employer], off  [address] by [employer] and [employer]. Contact information: [address] [id number] [place] [phone]               Discharge Instructions     Amb Referral to Cardiac Rehabilitation   Complete by: As directed    Diagnosis: STEMI   After initial evaluation and assessments completed: Virtual Based Care may be provided alone or in conjunction with Phase 2 Cardiac Rehab based on patient barriers.: Yes   Intensive Cardiac Rehabilitation ([place]) [facility] location only OR Traditional Cardiac  Rehabilitation (TCR) *If criteria for ICR are not met will enroll in TCR [facility] only): Yes   Diet - low sodium heart healthy   Complete by: As directed    Discharge instructions   Complete by: As directed    No driving until 94/17/40. No lifting over 10 lbs for 2 weeks. No sexual activity for 2 weeks. You may return to work on 03/22/22. Keep procedure site clean & dry. If you notice increased pain, swelling, bleeding or pus, call/return!  You may shower, but no soaking baths/hot tubs/pools for 1 week.   Increase activity slowly   Complete by: As directed         Discharge Medications   Allergies as of 03/16/2022       Reactions   Penicillins Shortness Of Breath, Rash, Other (See Comments)   Childhood allergy        Medication List     STOP taking these medications    hydrochlorothiazide 12.5 MG tablet Commonly known as: HYDRODIURIL   Potassium 99 MG Tabs   sitaGLIPtin-metformin 50-1000 MG tablet Commonly known as: Janumet       TAKE these medications    acetaminophen 500 MG tablet Commonly known as: TYLENOL Take 2 tablets (1,000 mg total) by mouth every 6 (six) hours as needed for mild pain, fever or moderate pain.   Alka-Seltzer Plus Cold 2-7.8-325 MG Tbef Generic drug: Chlorphen-Phenyleph-ASA Take 1 tablet by mouth every 6 (six) hours as needed (for symptoms- dissolve in water before drinking).   aspirin EC 81 MG tablet Take  1 tablet (81 mg total) by mouth daily. Swallow whole. Start taking on: March 17, 2022   atorvastatin 80 MG tablet Commonly known as: LIPITOR Take 1 tablet (80 mg total) by mouth daily.   buPROPion 300 MG 24 hr tablet Commonly known as: WELLBUTRIN XL Take 300 mg by mouth daily.   dapagliflozin propanediol 10 MG Tabs tablet Commonly known as: FARXIGA Take 1 tablet (10 mg total) by mouth daily. Start taking on: March 17, 2022   fenofibrate 160 MG tablet Take 1 tablet (160 mg total) by mouth daily. Start taking on: March 17, 2022   Insulin Syringes (Disposable) U-100 1 ML Misc Use one syringe per dose of insulin, administered twice daily (2 syringes per day).   metoprolol succinate 50 MG 24 hr tablet Commonly known as: TOPROL-XL Take 1 tablet (50 mg total) by mouth daily. Take with or immediately following a meal. Start taking on: March 17, 2022   Multi For Her 50+ Tabs Take 1 tablet by mouth daily. What changed: Another medication with the same name was removed. Continue taking this medication, and follow the directions you see here.   Hair/Skin/Nails/Biotin Tabs Take 3 tablets by mouth daily. What changed: Another medication with the same name was removed. Continue taking this medication, and follow the directions you see here.   Multivitamin Gummies Adult Chew Chew 1 tablet by mouth daily. What changed: Another medication with the same name was removed. Continue taking this medication, and follow the directions you see here.   NovoLIN 70/30 ReliOn (70-30) 100 UNIT/ML injection Generic drug: insulin NPH-regular Human Inject 40 Units into the skin in the morning and at bedtime. What changed: Another medication with the same name was removed. Continue taking this medication, and follow the directions you see here.   Vitamin D (Ergocalciferol) 50000 units Caps Take 5,000 Units by mouth daily.   Vitamin D3 125 MCG (5000 UT) Caps Take 5,000 Units by  mouth daily.            Outstanding Labs/Studies    Duration of Discharge Encounter   Greater than 30 minutes including physician time.  Signed, Lily Kocher, PA-C 03/16/2022, 1:40 PM

## 2022-03-16 NOTE — Plan of Care (Signed)
  Problem: Education: Goal: Understanding of cardiac disease, CV risk reduction, and recovery process will improve Outcome: Adequate for Discharge Goal: Individualized Educational Video(s) Outcome: Adequate for Discharge   Problem: Activity: Goal: Ability to tolerate increased activity will improve Outcome: Adequate for Discharge   Problem: Cardiac: Goal: Ability to achieve and maintain adequate cardiovascular perfusion will improve Outcome: Adequate for Discharge   Problem: Education: Goal: Ability to describe self-care measures that may prevent or decrease complications (Diabetes Survival Skills Education) will improve Outcome: Adequate for Discharge Goal: Individualized Educational Video(s) Outcome: Adequate for Discharge   Problem: Health Behavior/Discharge Planning: Goal: Ability to safely manage health-related needs after discharge will improve Outcome: Adequate for Discharge   Problem: Cardiovascular: Goal: Ability to achieve and maintain adequate cardiovascular perfusion will improve Outcome: Adequate for Discharge Goal: Vascular access site(s) Level 0-1 will be maintained Outcome: Adequate for Discharge   Problem: Cardiac: Goal: Ability to achieve and maintain adequate cardiopulmonary perfusion will improve Outcome: Adequate for Discharge

## 2022-03-19 ENCOUNTER — Telehealth (HOSPITAL_COMMUNITY): Payer: Self-pay

## 2022-03-19 NOTE — Telephone Encounter (Signed)
Pt insurance is active and benefits verified through [place] $40, DED 0/0 met, out of pocket $4,000/$96.94 met, co-insurance 0%. no pre-authorization required, Kim/[employer] 03/19/2022_0 :04am REF# [id number] & [id number] are not covered   How many CR sessions are covered? (36 sessions for TCR)36 Is this a lifetime maximum or an annual maximum? annual Has the member used any of these services to date? no Is there a time limit (weeks/months) on start of program and/or program completion? no   Will contact patient to see if she is interested in the Cardiac Rehab Program. If interested, patient will need to complete follow up appt. Once completed, patient will be contacted for scheduling upon review by the RN Navigator.

## 2022-03-28 NOTE — Progress Notes (Incomplete)
HEART & VASCULAR TRANSITION OF CARE CONSULT NOTE     Referring Physician: Dr. Harrell Gave Primary Care: Jonathon Jordan, MD Primary Cardiologist: Dr. Harrell Gave  HPI: Referred to clinic by Dr. Harrell Gave with [facility] for heart failure consultation. 61 y.o. female with history of IDDM II, HTN, HLD, obesity. Presented with STEMI 03/14/22. Cardiac cath with clean coronaries but apex akinetic on LV gram. MI thought to be d/t vasospasm vs stress-induced.   Recent financial and work-related stressors noted. Echo with EF 45-50% and dyskinetic apex. She was started on GDMT. DM noted to be uncontrolled with A1c 13.5%. Not taking diabetes meds prior to admit. Had severe hypertriglyceridemia 2/2 uncontrolled DM.  Cardiac Testing    Review of Systems: [y] = yes, [ ]  = no   General: Weight gain [ ] ; Weight loss [ ] ; Anorexia [ ] ; Fatigue [ ] ; Fever [ ] ; Chills [ ] ; Weakness [ ]   Cardiac: Chest pain/pressure [ ] ; Resting SOB [ ] ; Exertional SOB [ ] ; Orthopnea [ ] ; Pedal Edema [ ] ; Palpitations [ ] ; Syncope [ ] ; Presyncope [ ] ; Paroxysmal nocturnal dyspnea[ ]   Pulmonary: Cough [ ] ; Wheezing[ ] ; Hemoptysis[ ] ; Sputum [ ] ; Snoring [ ]   GI: Vomiting[ ] ; Dysphagia[ ] ; Melena[ ] ; Hematochezia [ ] ; Heartburn[ ] ; Abdominal pain [ ] ; Constipation [ ] ; Diarrhea [ ] ; BRBPR [ ]   GU: Hematuria[ ] ; Dysuria [ ] ; Nocturia[ ]   Vascular: Pain in legs with walking [ ] ; Pain in feet with lying flat [ ] ; Non-healing sores [ ] ; Stroke [ ] ; TIA [ ] ; Slurred speech [ ] ;  Neuro: Headaches[ ] ; Vertigo[ ] ; Seizures[ ] ; Paresthesias[ ] ;Blurred vision [ ] ; Diplopia [ ] ; Vision changes [ ]   Ortho/Skin: Arthritis [ ] ; Joint pain [ ] ; Muscle pain [ ] ; Joint swelling [ ] ; Back Pain [ ] ; Rash [ ]   Psych: Depression[ ] ; Anxiety[ ]   Heme: Bleeding problems [ ] ; Clotting disorders [ ] ; Anemia [ ]   Endocrine: Diabetes [ ] ; Thyroid dysfunction[ ]    Past Medical History:  Diagnosis Date   Diabetes mellitus without  complication (HCC)    Hypertension     Current Outpatient Medications  Medication Sig Dispense Refill   aspirin EC 81 MG tablet Take 1 tablet (81 mg total) by mouth daily. Swallow whole. 30 tablet 12   atorvastatin (LIPITOR) 80 MG tablet Take 1 tablet (80 mg total) by mouth daily. 30 tablet 3   dapagliflozin propanediol (FARXIGA) 10 MG TABS tablet Take 1 tablet (10 mg total) by mouth daily. 30 tablet 3   fenofibrate 160 MG tablet Take 1 tablet (160 mg total) by mouth daily. 30 tablet 2   insulin NPH-regular Human (NOVOLIN 70/30 RELION) (70-30) 100 UNIT/ML injection Inject 40 Units into the skin 2 (two) times daily. 30 mL 1   Insulin Syringes, Disposable, U-100 1 ML MISC Use one syringe per dose of insulin, administered twice daily (2 syringes per day). 200 each 0   Insulin Syringes, Disposable, U-100 1 ML MISC Use 1 syringe per insulin injection (2 syringes per day) 200 each 0   metoprolol succinate (TOPROL-XL) 50 MG 24 hr tablet Take 1 tablet (50 mg total) by mouth daily. Take with or immediately following a meal. 30 tablet 5   No current facility-administered medications for this visit.    Allergies  Allergen Reactions   Penicillins Shortness Of Breath, Rash and Other (See Comments)    Childhood allergy      Social History   Socioeconomic  History   Marital status: Widowed    Spouse name: Not on file   Number of children: 2   Years of education: Not on file   Highest education level: Some college, no degree  Occupational History   Occupation: Primary school teacher    Comment: [employer]  Tobacco Use   Smoking status: Never   Smokeless tobacco: Never  Vaping Use   Vaping Use: Never used  Substance and Sexual Activity   Alcohol use: No   Drug use: No   Sexual activity: Not on file  Other Topics Concern   Not on file  Social History Narrative   Not on file   Social Determinants of Health   Financial Resource Strain: Medium Risk (03/16/2022)    Overall Financial Resource Strain (CARDIA)    Difficulty of Paying Living Expenses: Somewhat hard  Food Insecurity: No Food Insecurity (03/16/2022)   Hunger Vital Sign    Worried About Running Out of Food in the Last Year: Never true    Ran Out of Food in the Last Year: Never true  Transportation Needs: No Transportation Needs (03/16/2022)   PRAPARE - Administrator, Civil Service (Medical): No    Lack of Transportation (Non-Medical): No  Physical Activity: Not on file  Stress: Not on file  Social Connections: Not on file  Intimate Partner Violence: Not At Risk (03/14/2022)   Humiliation, Afraid, Rape, and Kick questionnaire    Fear of Current or Ex-Partner: No    Emotionally Abused: No    Physically Abused: No    Sexually Abused: No      Family History  Adopted: Yes    There were no vitals filed for this visit.  PHYSICAL EXAM: General:  Well appearing. No respiratory difficulty HEENT: normal Neck: supple. no JVD. Carotids 2+ bilat; no bruits. No lymphadenopathy or thryomegaly appreciated. Cor: PMI nondisplaced. Regular rate & rhythm. No rubs, gallops or murmurs. Lungs: clear Abdomen: soft, nontender, nondistended. No hepatosplenomegaly. No bruits or masses. Good bowel sounds. Extremities: no cyanosis, clubbing, rash, edema Neuro: alert & oriented x 3, cranial nerves grossly intact. moves all 4 extremities w/o difficulty. Affect pleasant.  ECG:   ASSESSMENT & PLAN: HFmEF  Recent STEMI  Uncontrolled DM II  Hypertriglyceridemia   NYHA *** GDMT  Diuretic- BB- Ace/ARB/ARNI MRA SGLT2i    Referred to HFSW (PCP, Medications, Transportation, ETOH Abuse, Drug Abuse, Insurance, Financial ): Yes or No Refer to Pharmacy: Yes or No Refer to Home Health: Yes on No Refer to Advanced Heart Failure Clinic: Yes or no  Refer to General Cardiology: Yes or No  Follow up

## 2022-03-29 ENCOUNTER — Encounter (HOSPITAL_COMMUNITY): Payer: Managed Care, Other (non HMO)

## 2022-03-29 ENCOUNTER — Telehealth (HOSPITAL_COMMUNITY): Payer: Self-pay | Admitting: *Deleted

## 2022-03-29 NOTE — Progress Notes (Addendum)
HEART & VASCULAR TRANSITION OF CARE CONSULT NOTE     Referring Physician: Dr. Harrell Gave Primary Care: Jonathon Jordan, MD Primary Cardiologist: Dr. Harrell Gave  HPI: Referred to clinic by Dr. Harrell Gave with [facility] for heart failure consultation. 61 y.o. female with history of IDDM II, HTN, HLD, obesity.   Presented 03/14/22 with acute onset of CP. Diffuse ST elevations noted on ECG and code STEMI activated. Cardiac cath with clean coronaries but apex akinetic on LV gram. MI thought to be d/t vasospasm vs stress-induced.   Recent financial and work-related stressors noted. Echo with EF 45-50% and dyskinetic apex. She was started on GDMT. DM noted to be uncontrolled with A1c 13.5%. Not taking diabetes meds prior to admit. Had severe hypertriglyceridemia 2/2 uncontrolled DM.  Here today for hospital follow-up. Denies any recurrent CP since discharge. Had a little bit of dyspnea a couple of days after discharge but this has since resolved. Reports her home weight was down 6 lb to 297 lb as of 2023/04/05. Batteries in her scale died, she is planning to purchase new ones. Taking all meds as prescribed. Watching fluid and sodium intake. Home SBP averaging 120s up to 130.   She slipped and fell getting out of the shower on 10/20 and landed with her head up against the bathroom door. Her son helped her off the floor. Denies any dizziness or presyncope proceeding the fall. No dizziness, confusion or LOC after the event. Notes a little bit of neck stiffness but has full range of motion.     Review of Systems: [y] = yes, [ ]  = no   General: Weight gain [ ] ; Weight loss [ ] ; Anorexia [ ] ; Fatigue [ ] ; Fever [ ] ; Chills [ ] ; Weakness [ ]   Cardiac: Chest pain/pressure [ ] ; Resting SOB [ ] ; Exertional SOB [ ] ; Orthopnea [ ] ; Pedal Edema [ ] ; Palpitations [ ] ; Syncope [ ] ; Presyncope [ ] ; Paroxysmal nocturnal dyspnea[ ]   Pulmonary: Cough [ ] ; Wheezing[ ] ; Hemoptysis[ ] ; Sputum [ ] ; Snoring [ ]    GI: Vomiting[ ] ; Dysphagia[ ] ; Melena[ ] ; Hematochezia [ ] ; Heartburn[ ] ; Abdominal pain [ ] ; Constipation [ ] ; Diarrhea [ ] ; BRBPR [ ]   GU: Hematuria[ ] ; Dysuria [ ] ; Nocturia[ ]   Vascular: Pain in legs with walking [ ] ; Pain in feet with lying flat [ ] ; Non-healing sores [ ] ; Stroke [ ] ; TIA [ ] ; Slurred speech [ ] ;  Neuro: Headaches[ ] ; Vertigo[ ] ; Seizures[ ] ; Paresthesias[ ] ;Blurred vision [ ] ; Diplopia [ ] ; Vision changes [ ]   Ortho/Skin: Arthritis [ ] ; Joint pain [ ] ; Muscle pain [ ] ; Joint swelling [ ] ; Back Pain [ ] ; Rash [ ]   Psych: Depression[ ] ; Anxiety[Y]  Heme: Bleeding problems [ ] ; Clotting disorders [ ] ; Anemia [ ]   Endocrine: Diabetes [Y]; Thyroid dysfunction[ ]    Past Medical History:  Diagnosis Date   Diabetes mellitus without complication (HCC)    Hypertension     Current Outpatient Medications  Medication Sig Dispense Refill   aspirin EC 81 MG tablet Take 1 tablet (81 mg total) by mouth daily. Swallow whole. 30 tablet 12   atorvastatin (LIPITOR) 80 MG tablet Take 1 tablet (80 mg total) by mouth daily. 30 tablet 3   dapagliflozin propanediol (FARXIGA) 10 MG TABS tablet Take 1 tablet (10 mg total) by mouth daily. 30 tablet 3   fenofibrate 160 MG tablet Take 1 tablet (160 mg total) by mouth daily. 30 tablet 2  insulin NPH-regular Human (NOVOLIN 70/30 RELION) (70-30) 100 UNIT/ML injection Inject 40 Units into the skin 2 (two) times daily. 30 mL 1   Insulin Syringes, Disposable, U-100 1 ML MISC Use one syringe per dose of insulin, administered twice daily (2 syringes per day). 200 each 0   Insulin Syringes, Disposable, U-100 1 ML MISC Use 1 syringe per insulin injection (2 syringes per day) 200 each 0   metoprolol succinate (TOPROL-XL) 50 MG 24 hr tablet Take 1 tablet (50 mg total) by mouth daily. Take with or immediately following a meal. 30 tablet 5   sacubitril-valsartan (ENTRESTO) 24-26 MG Take 1 tablet by mouth 2 (two) times daily. 60 tablet 6   No current  facility-administered medications for this encounter.    Allergies  Allergen Reactions   Penicillins Shortness Of Breath, Rash and Other (See Comments)    Childhood allergy      Social History   Socioeconomic History   Marital status: Widowed    Spouse name: Not on file   Number of children: 2   Years of education: Not on file   Highest education level: Some college, no degree  Occupational History   Occupation: Primary school teacher    Comment: [employer]  Tobacco Use   Smoking status: Never   Smokeless tobacco: Never  Vaping Use   Vaping Use: Never used  Substance and Sexual Activity   Alcohol use: No   Drug use: No   Sexual activity: Not on file  Other Topics Concern   Not on file  Social History Narrative   Not on file   Social Determinants of Health   Financial Resource Strain: Low Risk  (03/30/2022)   Overall Financial Resource Strain (CARDIA)    Difficulty of Paying Living Expenses: Not very hard  Recent Concern: Financial Resource Strain - Medium Risk (03/16/2022)   Overall Financial Resource Strain (CARDIA)    Difficulty of Paying Living Expenses: Somewhat hard  Food Insecurity: No Food Insecurity (03/16/2022)   Hunger Vital Sign    Worried About Running Out of Food in the Last Year: Never true    Ran Out of Food in the Last Year: Never true  Transportation Needs: No Transportation Needs (03/16/2022)   PRAPARE - Administrator, Civil Service (Medical): No    Lack of Transportation (Non-Medical): No  Physical Activity: Not on file  Stress: Not on file  Social Connections: Not on file  Intimate Partner Violence: Not At Risk (03/14/2022)   Humiliation, Afraid, Rape, and Kick questionnaire    Fear of Current or Ex-Partner: No    Emotionally Abused: No    Physically Abused: No    Sexually Abused: No      Family History  Adopted: Yes    Vitals:   03/30/22 1108  BP: 120/80  Pulse: 74  SpO2: 96%  Weight: 132.9 kg  (293 lb)    PHYSICAL EXAM: General:  Well appearing. Ambulated into clinic. HEENT: normal Neck: supple. JVD difficult to assess d/t thick neck. Carotids 2+ bilat; no bruits.  Cor: PMI nondisplaced. Regular rate & rhythm. No rubs, gallops or murmurs. Lungs: clear Abdomen: obese, soft, nontender, nondistended.  Extremities: no cyanosis, clubbing, rash, edema Neuro: alert & oriented x 3, cranial nerves grossly intact. moves all 4 extremities w/o difficulty. Affect pleasant.  ECG: SR 74 bpm, ST changes in inferior and anterolateral leads (slightly less pronounced than on prior ECG 10/10)    ASSESSMENT & PLAN: HFmEF -Suspect d/t  possible Takotsubo cardiomyopathy. Significant financial and work related stressors in recent months -LHC with no significant CAD. Apex akinetic on LV gram. -Echo with EF 45-50% and dyskinetic apex -NYHA II. No requiring loop diuretic -Continue Metoprolol xl 50 daily -Continue Farxiga 10 mg daily (watch for symptoms of UTI/yeast infection with uncontrolled DM) -Add Entresto 24/26 mg BID -BMET/BNP today. Can repeat BMET at f/u with Cardiology next week. -Expect LV function will recover. Repeat echo in a couple of months.  Recent STEMI without CAD -No CAD on LHC -Possible Takotsubo cardiomyopathy as above -Continue beta blocker -No recurrent chest pain  Uncontrolled DM II -Had been out of meds for months prior to admission -A1c 13.5% -Now on insulin and farxiga -Had a long discussion about importance of adherence with medical therapy. Finances previously hindered compliance.  Hypertriglyceridemia Hypercholesterolemia -Triglycerides recently 540. Suspect this will improve with control of DM. Now on fenofibrate -Direct LDL 292. Continue Atorvastatin 80. Will need to be rechecked. May require addition of PCSK-9 Inhibitor.  -Management per Cardiology  Morbid obesity -Eager to work on weight loss -Can consider referral to pharmacy for Ozempic  HTN -BP  stable. -Meds as above   NYHA II GDMT  Diuretic-N/a BB-Metoprolol XL 50 mg daily Ace/ARB/ARNI-Adding Entresto 24/26 mg BID MRA-Not yet SGLT2i-Farxiga 10 mg daily    Referred to HFSW (PCP, Medications, Transportation, ETOH Abuse, Drug Abuse, Insurance, Financial ): Yes, assist with counseling resources Refer to Pharmacy: Yes, assist with reviewing medication costs Refer to Home Health: No Refer to Advanced Heart Failure Clinic: No Refer to General Cardiology: No, already established  Follow up  As needed, keep follow-up with Cardiology as scheduled 04/09/22

## 2022-03-29 NOTE — Telephone Encounter (Signed)
Call attempted to confirm HV TOC appt 3 pm on 03/29/22. HIPPA appropriate VM left with callback number.    Earnestine Leys, BSN, Clinical cytogeneticist Only

## 2022-03-30 ENCOUNTER — Encounter (HOSPITAL_COMMUNITY): Payer: Managed Care, Other (non HMO)

## 2022-03-30 ENCOUNTER — Telehealth: Payer: Self-pay | Admitting: Cardiology

## 2022-03-30 ENCOUNTER — Ambulatory Visit (HOSPITAL_COMMUNITY)
Admission: RE | Admit: 2022-03-30 | Discharge: 2022-03-30 | Disposition: A | Discharge status: Home / Self Care | Payer: Managed Care, Other (non HMO) | Source: Ambulatory Visit | Attending: Physician Assistant

## 2022-03-30 ENCOUNTER — Encounter (HOSPITAL_COMMUNITY): Payer: Self-pay

## 2022-03-30 ENCOUNTER — Telehealth (HOSPITAL_COMMUNITY): Payer: Self-pay | Admitting: *Deleted

## 2022-03-30 VITALS — BP 120/80 | HR 74 | Wt 293.0 lb

## 2022-03-30 DIAGNOSIS — E7800 Pure hypercholesterolemia, unspecified: Secondary | ICD-10-CM

## 2022-03-30 DIAGNOSIS — I110 Hypertensive heart disease with heart failure: Secondary | ICD-10-CM | POA: Diagnosis not present

## 2022-03-30 DIAGNOSIS — E1165 Type 2 diabetes mellitus with hyperglycemia: Secondary | ICD-10-CM | POA: Diagnosis not present

## 2022-03-30 DIAGNOSIS — E782 Mixed hyperlipidemia: Secondary | ICD-10-CM | POA: Diagnosis not present

## 2022-03-30 DIAGNOSIS — Z79899 Other long term (current) drug therapy: Secondary | ICD-10-CM | POA: Insufficient documentation

## 2022-03-30 DIAGNOSIS — F1010 Alcohol abuse, uncomplicated: Secondary | ICD-10-CM | POA: Insufficient documentation

## 2022-03-30 DIAGNOSIS — I213 ST elevation (STEMI) myocardial infarction of unspecified site: Secondary | ICD-10-CM

## 2022-03-30 DIAGNOSIS — I252 Old myocardial infarction: Secondary | ICD-10-CM | POA: Insufficient documentation

## 2022-03-30 DIAGNOSIS — E781 Pure hyperglyceridemia: Secondary | ICD-10-CM

## 2022-03-30 DIAGNOSIS — I5022 Chronic systolic (congestive) heart failure: Secondary | ICD-10-CM

## 2022-03-30 DIAGNOSIS — I10 Essential (primary) hypertension: Secondary | ICD-10-CM

## 2022-03-30 LAB — BASIC METABOLIC PANEL
Anion gap: 14 (ref 5–15)
BUN: 19 mg/dL (ref 6–20)
CO2: 25 mmol/L (ref 22–32)
Calcium: 9.6 mg/dL (ref 8.9–10.3)
Chloride: 98 mmol/L (ref 98–111)
Creatinine, Ser: 1.32 mg/dL — ABNORMAL HIGH (ref 0.44–1.00)
GFR, Estimated: 47 mL/min — ABNORMAL LOW (ref >60)
Glucose, Bld: 439 mg/dL — ABNORMAL HIGH (ref 70–99)
Potassium: 4.5 mmol/L (ref 3.5–5.1)
Sodium: 137 mmol/L (ref 135–145)

## 2022-03-30 LAB — BRAIN NATRIURETIC PEPTIDE: B Natriuretic Peptide: 283.7 pg/mL — ABNORMAL HIGH (ref 0.0–100.0)

## 2022-03-30 MED ORDER — ENTRESTO 24-26 MG PO TABS: 1.0000 | ORAL_TABLET | Freq: Two times a day (BID) | ORAL | 6 refills | Status: DC

## 2022-03-30 NOTE — Progress Notes (Signed)
Heart and Vascular Care Navigation  03/30/2022  Diane Lawson 22-Jun-1960 [id number]  Reason for Referral: Patient seen in HF [facility]   Engaged with patient face to face for initial visit for Heart and Vascular Care Coordination.                                                                                                   Assessment:   Patient is a 61 yo female who was recently diagnosed with heart failure. She reports that her husband passed away 19 years ago from colon cancer and she had a 58 yo daughter and 2 yo son. She states that with multiple life events it is a constant reminder that he is gone. She noted that her son graduated form HS and soon from college and and her daughter is not married yet and all these events bring on the grief.  She noted that she was having some financial difficulty but was bale to liquidate her 401K and managed to pay off all her bills and states she is financially stable now.                                 HRT/VAS Care Coordination     Patients Home Cardiology Office Heart Failure Clinic  [facility]   Outpatient Care Team Social Worker   Social Worker Name: Lasandra Beech, [place] [phone]   Living arrangements for the past 2 months Single Family Home   Lives with: Adult Children  26 yo son   Patient Current Optometrist   Patient Has Concern With Paying Medical Bills No   Does Patient Have Prescription Coverage? Yes   Home Assistive Devices/Equipment CBG Meter       Social History:                                                                             SDOH Screenings   Food Insecurity: No Food Insecurity (03/16/2022)  Housing: Low Risk  (03/16/2022)  Transportation Needs: No Transportation Needs (03/16/2022)  Utilities: Not At Risk (03/16/2022)  Alcohol Screen: Low Risk  (03/16/2022)  Financial Resource Strain: Low Risk  (03/30/2022)  Recent Concern: Financial Resource Strain - Medium Risk (03/16/2022)   Tobacco Use: Low Risk  (03/30/2022)    SDOH Interventions: Financial Resources:    Patient resolved the issue her self with retirement Scientist, research (life sciences) Insecurity:   N/a  Housing Insecurity:   N/a  Transportation:    N/a   Other Care Navigation Interventions:     Inpatient/Outpatient Substance Abuse Counseling/Rehab Options N/a  Provided Pharmacy assistance resources    Patient expressed Mental Health concerns No.  Patient Referred to: To [facility] for grief support groups   Follow-up plan:  Patient will follow up with Grief Support Groups and has CSW number for future need. [name], [name], [name]

## 2022-03-30 NOTE — Telephone Encounter (Signed)
Please advise 

## 2022-03-30 NOTE — Telephone Encounter (Signed)
Call attempted to confirm HV TOC appt 11 am on 03/30/22. HIPPA appropriate VM left with callback number.    Earnestine Leys, BSN, Clinical cytogeneticist Only

## 2022-03-30 NOTE — Telephone Encounter (Signed)
Mychart message sent patient on why needs FMLA

## 2022-03-30 NOTE — Telephone Encounter (Signed)
Patient seen today, asking about being out of work and [employer], please advise

## 2022-03-30 NOTE — Telephone Encounter (Signed)
Would recommend discussing with her cardiology provider, Dr. Harrell Gave. Paperwork was filled out by [facility] during hospital admission and will follow with cardiology going forward.

## 2022-03-30 NOTE — Telephone Encounter (Signed)
Pt calling because she wants to know if she can get a letter saying she can be out of work until after she see Dr. Harrell Gave 04/09/22. She also wants to know if we can assist her with her FMLA paperwork.

## 2022-03-30 NOTE — Patient Instructions (Signed)
Start Entresto 24/26 mg twice daily. Free 30 day coupon plus $10.00 co-pay card given to patient. Rx sent to local pharmacy. Labs obtained at clinic visit today - we will call you if abnormal. Call [facility] as needed. No follow up scheduled with [place]. Continue follow up with Dr. Harrell Gave.

## 2022-03-31 ENCOUNTER — Telehealth (HOSPITAL_COMMUNITY): Payer: Self-pay | Admitting: *Deleted

## 2022-03-31 NOTE — Telephone Encounter (Signed)
Called patient per Diane Rinks, PA with following message re: lab results:  BNP much better. Renal function stable. Glucose elevated > 400, this is not far from where it has been in the hospital. Needs to check sugars regularly and contact PCP if remains this high  Pt says she checked blood sugar today and it was 350. Advised her to see PCP for diabetes management and she reports she has upcoming appointment. She will continue to check blood sugars until that appointment.

## 2022-04-01 NOTE — Telephone Encounter (Signed)
Left message to call back  

## 2022-04-01 NOTE — Telephone Encounter (Signed)
We can discuss at her visit. Unfortunately from a cardiology perspective, there are strict limitations on what qualifies for FMLA. We will discuss these items. If she does not qualify for FMLA from a cardiology perspective, she can discuss with her PCP if there are other reasons she might qualify.

## 2022-04-09 ENCOUNTER — Ambulatory Visit (HOSPITAL_BASED_OUTPATIENT_CLINIC_OR_DEPARTMENT_OTHER): Payer: Managed Care, Other (non HMO) | Admitting: Cardiology

## 2022-04-09 VITALS — BP 130/80 | HR 83 | Ht 67.0 in | Wt 299.3 lb

## 2022-04-09 DIAGNOSIS — E7800 Pure hypercholesterolemia, unspecified: Secondary | ICD-10-CM

## 2022-04-09 DIAGNOSIS — I213 ST elevation (STEMI) myocardial infarction of unspecified site: Secondary | ICD-10-CM

## 2022-04-09 DIAGNOSIS — E781 Pure hyperglyceridemia: Secondary | ICD-10-CM

## 2022-04-09 DIAGNOSIS — I5181 Takotsubo syndrome: Secondary | ICD-10-CM

## 2022-04-09 DIAGNOSIS — E1165 Type 2 diabetes mellitus with hyperglycemia: Secondary | ICD-10-CM | POA: Diagnosis not present

## 2022-04-09 DIAGNOSIS — I428 Other cardiomyopathies: Secondary | ICD-10-CM

## 2022-04-09 DIAGNOSIS — I10 Essential (primary) hypertension: Secondary | ICD-10-CM

## 2022-04-09 NOTE — Progress Notes (Signed)
Cardiology Office Note:    Date:  04/09/2022   ID:  Diane Lawson, DOB September 09, 1960, MRN [id number]  PCP:  Mila Palmer, MD  Cardiologist:  Jodelle Red, MD  Referring MD: Mila Palmer, MD   CC: follow up  History of Present Illness:    Diane Lawson is a 61 y.o. female with a hx of DM, HTN, STEMI, and Takotsubo syndrome who is seen for hospital follow up. She was admitted on 03/14/22 for a STEMI.  In review of Dr. Raiford Simmonds note from hospitalization: [name] was in her normal state of health until 03/13/22 evening at a family member birthday party. She walked inside around 5:30pm, and developed acute onset 12/10 neck pain radiating to the left shoulder and whole chest. It remained unbearable for around 30 minutes, however slowly began to ease off. Overnight, 10/7-10/8, she continued to have severe chest pain, down to a 4-5/10. She slept minimally. Today 10/8 she continued to have chest pain all day long, and eventually decided to come to the ED. She denies having any associated n/v/sob, diaphoresis, CP or SOB on exertion, similar or prior episodes, recent sickness or illness.   In the ED, found to have troponins [phone] with ST elevations in multiple leads. STEMI activated. She received ASA 324mg  and heparin 4000u. Chest pain improved and eventually chest pain free. Taken to the cath lab. In cath lab, found to have clean coronaries, however apex akinetic on ventriculogram. No occult lesions.   On history, patient states had had many recent stressors including home finances, not being able to afford her medications, just started to withdrawal funds from her retirement.   Today, she states that she is doing okay overall. She still has some mild intermittent twinges of pain in her left chest. This is nothing like what she experienced with her STEMI. She reports that she continues to be fatigued as well.  Her leg swelling has resolved.  She states  that her cardiac event has really taken a toll on her emotionally. She reports that she has not returned to work yet. Her work is very stressful on a daily basis. She was advised by her employer on various coping mechanisms to reduce her work stress.  She has been compliant with her daily medications without any adverse side effects.  The patient denies dyspnea at rest or with exertion, PND, or orthopnea. Denies cough, fever, chills, nausea, or vomiting. Denies syncope. Denies dizziness or lightheadedness.    She notes some right wrist pain particularly with twisting her forearm. She suspects that this is more so related to carpal tunnel but wonders if it is due to her heart cath.  Past Medical History:  Diagnosis Date   Diabetes mellitus without complication (HCC)    Hypertension     Past Surgical History:  Procedure Laterality Date   BACK SURGERY  2007   CESAREAN SECTION     CORONARY/GRAFT ACUTE MI REVASCULARIZATION N/A 03/14/2022   Procedure: Coronary/Graft Acute MI Revascularization;  Surgeon: 05/14/2022, MD;  Location: [facility] INVASIVE [facility];  Service: Cardiovascular;  Laterality: N/A;   FRACTURE SURGERY     ankle (1999)   LAPAROSCOPIC APPENDECTOMY N/A 11/06/2020   Procedure: APPENDECTOMY LAPAROSCOPIC;  Surgeon: 01/06/2021, MD;  Location: [facility] OR;  Service: General;  Laterality: N/A;   LEFT HEART CATH AND CORONARY ANGIOGRAPHY N/A 03/14/2022   Procedure: LEFT HEART CATH AND CORONARY ANGIOGRAPHY;  Surgeon: 05/14/2022, MD;  Location: [facility] CV [facility];  Service: Cardiovascular;  Laterality: N/A;    Current Medications: Current Outpatient Medications on File Prior to Visit  Medication Sig   aspirin EC 81 MG tablet Take 1 tablet (81 mg total) by mouth daily. Swallow whole.   atorvastatin (LIPITOR) 80 MG tablet Take 1 tablet (80 mg total) by mouth daily.   dapagliflozin propanediol (FARXIGA) 10 MG TABS tablet Take 1 tablet (10 mg total) by mouth daily.   fenofibrate 160 MG  tablet Take 1 tablet (160 mg total) by mouth daily.   insulin NPH-regular Human (NOVOLIN 70/30 RELION) (70-30) 100 UNIT/ML injection Inject 40 Units into the skin 2 (two) times daily.   Insulin Syringes, Disposable, U-100 1 ML MISC Use one syringe per dose of insulin, administered twice daily (2 syringes per day).   Insulin Syringes, Disposable, U-100 1 ML MISC Use 1 syringe per insulin injection (2 syringes per day)   metoprolol succinate (TOPROL-XL) 50 MG 24 hr tablet Take 1 tablet (50 mg total) by mouth daily. Take with or immediately following a meal.   sacubitril-valsartan (ENTRESTO) 24-26 MG Take 1 tablet by mouth 2 (two) times daily.   No current facility-administered medications on file prior to visit.     Allergies:   Penicillins   Social History   Tobacco Use   Smoking status: Never   Smokeless tobacco: Never  Vaping Use   Vaping Use: Never used  Substance Use Topics   Alcohol use: No   Drug use: No    Family History: family history is not on file. She was adopted.  ROS:   Please see the history of present illness.  Additional pertinent ROS otherwise unremarkable.   EKGs/Labs/Other Studies Reviewed:    The following studies were reviewed today:  Echo 03/15/22: Left ventricular ejection fraction, by estimation, is 45 to 50%. The left ventricle has mildly decreased function. The left ventricle demonstrates regional wall motion abnormalities (see scoring diagram/findings for description). Left ventricular diastolic parameters are consistent with Grade I diastolic dysfunction (impaired relaxation). 2. Right ventricular systolic function is normal. The right ventricular size is normal. The mitral valve is normal in structure. No evidence of mitral valve regurgitation. No evidence of mitral stenosis. 3.The aortic valve is normal in structure. Aortic valve regurgitation is not visualized. No aortic stenosis is present. 4.The inferior vena cava is normal in size with  greater than 50% respiratory variability, suggesting right atrial pressure of 3 mmHg.  Left Heart cath 03/14/22: IMPRESSION: Ms. Diane Lawson  has essentially normal coronary arteries.  She has moderate to severe LV dysfunction with wall motion consistent with "Takotsubo syndrome".  The radial sheath was removed and a TR band was placed.  The femoral sheath was removed and pressure was held.  She left lab in stable condition.  2D echo was ordered.  She will need guideline directed to medical therapy for LV dysfunction.  I suspect her LVEF will improve over the next 3 to 6 months.   EKG:  EKG was personally reviewed 04/09/22: NSR at 83 bpm with t wave inversions  Recent Labs: 03/14/2022: TSH 1.923 03/15/2022: Hemoglobin 15.5; Platelets 181 03/30/2022: B Natriuretic Peptide 283.7; BUN 19; Creatinine, Ser 1.32; Potassium 4.5; Sodium 137  Recent Lipid Panel    Component Value Date/Time   CHOL 437 (H) 03/14/2022 2025   TRIG 540 (H) 03/14/2022 2025   HDL 41 03/14/2022 2025   CHOLHDL 10.7 03/14/2022 2025   VLDL UNABLE TO CALCULATE IF TRIGLYCERIDE OVER 400 mg/dL 03/14/2022 2025   LDLCALC UNABLE TO CALCULATE IF  TRIGLYCERIDE OVER 400 mg/dL 81/19/1478 2956   LDLDIRECT 292 (H) 03/14/2022 2025    Physical Exam:    VS:  BP 130/80 (BP Location: Left Arm, Patient Position: Sitting, Cuff Size: Large)   Pulse 83   Ht 5\' 7"  (1.702 m)   Wt 299 lb 4.8 oz (135.8 kg)   LMP 10/24/2013   BMI 46.88 kg/m     Wt Readings from Last 3 Encounters:  04/09/22 299 lb 4.8 oz (135.8 kg)  03/30/22 293 lb (132.9 kg)  03/14/22 299 lb 13.2 oz (136 kg)    GEN: Well nourished, well developed in no acute distress HEENT: Normal, moist mucous membranes NECK: No JVD CARDIAC: regular rhythm, normal S1 and S2, no rubs or gallops. No murmur. VASCULAR: Radial and DP pulses 2+ bilaterally. No carotid bruits RESPIRATORY:  Clear to auscultation without rales, wheezing or rhonchi  ABDOMEN: Soft, non-tender,  non-distended MUSCULOSKELETAL:  Ambulates independently SKIN: Warm and dry, no edema NEUROLOGIC:  Alert and oriented x 3. No focal neuro deficits noted. PSYCHIATRIC:  Normal affect    ASSESSMENT:    1. ST elevation myocardial infarction (STEMI), unspecified artery (HCC)   2. Takotsubo cardiomyopathy   3. Hypercholesterolemia   4. Poorly controlled type 2 diabetes mellitus (HCC)   5. Hypertriglyceridemia   6. Morbid obesity (HCC)   7. Primary hypertension   8. NICM (nonischemic cardiomyopathy) (HCC)    PLAN:    STEMI without CAD Possible Takotsubo vs. Vasospasm Nonischemic cardiomyopathy -akinetic apex on LV gram, echo with EF 45-50% with dyskinetic apex -continue metoprolol, entresto, farxiga -recheck echo after 3 months of GDMT  Discussed FMLA. I recommend she attend cardiac rehab, would be happy to note as such if needed for her work.   Type II diabetes -A1c 13.5 -now on farxiga and insulin   Hypertriglyceridemia Hypercholesterolemia Elevated Lp(a) -suspect elevated TG 2/2 type II diabetes being uncontrolled -No CAD, but on aspirin/statin for diabetes CV risk reduction. Continue atorvastatin and aspirin 81 mg. Direct LDL was 292.  -lp(a) elevated at 139 -recheck TG as an outpatient, suspect DM control will improve TG -has been started on fenofibrate, this can be stopped if TG improved -recheck lipids prior to next visit   Hypertension -home meds were HCTZ; stopped HCTZ given elevated TG -now on GDMT for nonischemic cardiomyopathy  Cardiac risk counseling and prevention recommendations: -recommend heart healthy/Mediterranean diet, with whole grains, fruits, vegetable, fish, lean meats, nuts, and olive oil. Limit salt. -recommend moderate walking, 3-5 times/week for 30-50 minutes each session. Aim for at least 150 minutes.week. Goal should be pace of 3 miles/hours, or walking 1.5 miles in 30 minutes -recommend avoidance of tobacco products. Avoid excess  alcohol.  Plan for follow up: 6-8 weeks or sooner if needed  05/14/22, MD, PhD, [facility] [place]  [facility]    Medication Adjustments/Labs and Tests Ordered: Current medicines are reviewed at length with the patient today.  Concerns regarding medicines are outlined above.  Orders Placed This Encounter  Procedures   EKG 12-Lead   No orders of the defined types were placed in this encounter.   Patient Instructions  Medication Instructions:  None  *If you need a refill on your cardiac medications before your next appointment, please call your pharmacy*   Lab Work: None   Testing/Procedures: None   Follow-Up: At [facility], you and your health needs are our priority.  As part of our continuing mission to provide you with exceptional heart care, we have created  designated Provider Care Teams.  These Care Teams include your primary Cardiologist (physician) and Advanced Practice Providers (APPs -  Physician Assistants and Nurse Practitioners) who all work together to provide you with the care you need, when you need it.  We recommend signing up for the patient portal called "MyChart".  Sign up information is provided on this After Visit Summary.  MyChart is used to connect with patients for Virtual Visits (Telemedicine).  Patients are able to view lab/test results, encounter notes, upcoming appointments, etc.  Non-urgent messages can be sent to your provider as well.   To learn more about what you can do with MyChart, go to [email or web address].    Your next appointment:   6 week(s)  The format for your next appointment:   In Person  Provider:   Jodelle Red, MD    Other Instructions None         I,Alexis Herring,acting as a scribe for Jodelle Red, MD.,have documented all relevant documentation on the behalf of Jodelle Red, MD,as directed by  Jodelle Red, MD while in the presence of Jodelle Red, MD.  I, Jodelle Red, MD, have reviewed all documentation for this visit. The documentation on 07/07/22 for the exam, diagnosis, procedures, and orders are all accurate and complete.   Signed, Jodelle Red, MD PhD 04/09/2022     [facility]

## 2022-04-09 NOTE — Patient Instructions (Signed)
Medication Instructions:  None  *If you need a refill on your cardiac medications before your next appointment, please call your pharmacy*   Lab Work: None   Testing/Procedures: None   Follow-Up: At [facility], you and your health needs are our priority.  As part of our continuing mission to provide you with exceptional heart care, we have created designated Provider Care Teams.  These Care Teams include your primary Cardiologist (physician) and Advanced Practice Providers (APPs -  Physician Assistants and Nurse Practitioners) who all work together to provide you with the care you need, when you need it.  We recommend signing up for the patient portal called "MyChart".  Sign up information is provided on this After Visit Summary.  MyChart is used to connect with patients for Virtual Visits (Telemedicine).  Patients are able to view lab/test results, encounter notes, upcoming appointments, etc.  Non-urgent messages can be sent to your provider as well.   To learn more about what you can do with MyChart, go to [email or web address].    Your next appointment:   6 week(s)  The format for your next appointment:   In Person  Provider:   Buford Dresser, MD    Other Instructions None

## 2022-04-14 NOTE — Telephone Encounter (Signed)
Pt completed appointment on 11/3

## 2022-04-15 NOTE — Telephone Encounter (Signed)
Forms provided to Dr. Cristal Deer to complete.

## 2022-04-15 NOTE — Telephone Encounter (Signed)
Hey sorry to bug you, but I can't tell from the chart if this got done, can you tell me?

## 2022-04-19 ENCOUNTER — Encounter (HOSPITAL_BASED_OUTPATIENT_CLINIC_OR_DEPARTMENT_OTHER): Payer: Self-pay

## 2022-04-20 ENCOUNTER — Other Ambulatory Visit (HOSPITAL_COMMUNITY): Payer: Self-pay

## 2022-04-21 ENCOUNTER — Other Ambulatory Visit (HOSPITAL_COMMUNITY): Payer: Self-pay

## 2022-05-16 ENCOUNTER — Other Ambulatory Visit (HOSPITAL_COMMUNITY): Payer: Self-pay

## 2022-05-19 ENCOUNTER — Telehealth (HOSPITAL_COMMUNITY): Payer: Self-pay

## 2022-05-19 ENCOUNTER — Telehealth (HOSPITAL_COMMUNITY): Payer: Self-pay | Admitting: *Deleted

## 2022-05-19 NOTE — Telephone Encounter (Signed)
Called patient to see if she was interested in participating in the Cardiac Rehab Program. Patient stated yes. Patient will come in for orientation on 05/20/22@10 :30am and will attend the 12:30pm exercise class.

## 2022-05-19 NOTE — Telephone Encounter (Signed)
Called to confirm appointments and says that her CBG's are consistently running between 250 and 350 on a daily basis. Diane Lawson has an appointment with Dr Paulino Rily her primary care provider next week. I advised that Diane Lawson not start exercise tomorrow as her blood sugars continue to run high. Patient would benefit from better diabetes management. Says she has not seen her endocrinologist in a long time. I will call Dr Paulino Rily to notify. Patient says she has been under a lot of stress recently related to her job and family stress due to the loss of her father and needing to help her mother who has financial needs. Patient says she is agreeable to receiving counseling. Will give name and contact information to Hilbert Corrigan PsyD per patient's request. I told Diane Lawson that she should be able to receive virtual counseling. Diane Lawson was worried about her work schedule. Will cancel current appointments and reschedule what her blood sugars are improved. Will notify Cristal Deer. Patient is agreeable to the plan.Gladstone Lighter, RN,BSN 05/19/2022 3:14 PM

## 2022-05-20 ENCOUNTER — Other Ambulatory Visit (HOSPITAL_COMMUNITY): Payer: Self-pay

## 2022-05-20 ENCOUNTER — Ambulatory Visit (HOSPITAL_COMMUNITY): Payer: Managed Care, Other (non HMO)

## 2022-05-21 ENCOUNTER — Other Ambulatory Visit: Payer: Self-pay

## 2022-05-24 ENCOUNTER — Other Ambulatory Visit (HOSPITAL_COMMUNITY): Payer: Self-pay

## 2022-05-24 ENCOUNTER — Ambulatory Visit (HOSPITAL_COMMUNITY): Payer: Managed Care, Other (non HMO)

## 2022-05-24 ENCOUNTER — Other Ambulatory Visit: Payer: Self-pay

## 2022-05-24 MED ORDER — BUPROPION HCL ER (XL) 150 MG PO TB24
300.0000 mg | ORAL_TABLET | Freq: Every morning | ORAL | 5 refills | Status: DC
  Filled 2022-05-24: qty 60, 30d supply, fill #0
  Filled 2022-05-26: qty 30, 15d supply, fill #0
  Filled 2022-06-05 – 2022-06-08 (×2): qty 30, 15d supply, fill #1
  Filled 2022-06-21: qty 30, 15d supply, fill #2
  Filled 2022-07-05 – 2022-07-10 (×2): qty 30, 15d supply, fill #3
  Filled 2022-07-23: qty 30, 15d supply, fill #4
  Filled 2022-08-07: qty 30, 15d supply, fill #5
  Filled 2022-08-22: qty 30, 15d supply, fill #6
  Filled 2022-09-03: qty 30, 15d supply, fill #7
  Filled 2022-09-18: qty 60, 30d supply, fill #8
  Filled 2022-10-17: qty 60, 30d supply, fill #9

## 2022-05-25 ENCOUNTER — Other Ambulatory Visit (HOSPITAL_COMMUNITY): Payer: Self-pay

## 2022-05-26 ENCOUNTER — Other Ambulatory Visit (HOSPITAL_COMMUNITY): Payer: Self-pay

## 2022-05-26 ENCOUNTER — Other Ambulatory Visit: Payer: Self-pay

## 2022-05-26 ENCOUNTER — Ambulatory Visit (HOSPITAL_COMMUNITY): Payer: Managed Care, Other (non HMO)

## 2022-05-28 ENCOUNTER — Ambulatory Visit (HOSPITAL_COMMUNITY): Payer: Managed Care, Other (non HMO)

## 2022-06-02 ENCOUNTER — Ambulatory Visit (HOSPITAL_COMMUNITY): Payer: Managed Care, Other (non HMO)

## 2022-06-04 ENCOUNTER — Ambulatory Visit (HOSPITAL_COMMUNITY): Payer: Managed Care, Other (non HMO)

## 2022-06-05 ENCOUNTER — Other Ambulatory Visit (HOSPITAL_COMMUNITY): Payer: Self-pay

## 2022-06-09 ENCOUNTER — Ambulatory Visit (HOSPITAL_COMMUNITY): Payer: Managed Care, Other (non HMO)

## 2022-06-10 ENCOUNTER — Ambulatory Visit (HOSPITAL_BASED_OUTPATIENT_CLINIC_OR_DEPARTMENT_OTHER): Payer: Managed Care, Other (non HMO) | Admitting: Cardiology

## 2022-06-11 ENCOUNTER — Ambulatory Visit (HOSPITAL_COMMUNITY): Payer: Managed Care, Other (non HMO)

## 2022-06-14 ENCOUNTER — Ambulatory Visit (HOSPITAL_COMMUNITY): Payer: Managed Care, Other (non HMO)

## 2022-06-14 ENCOUNTER — Other Ambulatory Visit (HOSPITAL_COMMUNITY): Payer: Self-pay

## 2022-06-14 ENCOUNTER — Encounter (HOSPITAL_BASED_OUTPATIENT_CLINIC_OR_DEPARTMENT_OTHER): Payer: Self-pay

## 2022-06-14 DIAGNOSIS — I5022 Chronic systolic (congestive) heart failure: Secondary | ICD-10-CM

## 2022-06-15 ENCOUNTER — Other Ambulatory Visit: Payer: Self-pay

## 2022-06-15 ENCOUNTER — Other Ambulatory Visit (HOSPITAL_COMMUNITY): Payer: Self-pay

## 2022-06-15 MED ORDER — ENTRESTO 24-26 MG PO TABS
1.0000 | ORAL_TABLET | Freq: Two times a day (BID) | ORAL | 6 refills | Status: DC
  Filled 2022-06-15: qty 60, 30d supply, fill #0
  Filled 2022-07-10: qty 60, 30d supply, fill #1
  Filled 2022-08-07: qty 60, 30d supply, fill #2
  Filled 2022-09-06 (×2): qty 60, 30d supply, fill #3
  Filled 2022-10-06: qty 60, 30d supply, fill #4
  Filled 2022-11-03: qty 60, 30d supply, fill #5
  Filled 2022-12-07: qty 60, 30d supply, fill #6

## 2022-06-16 ENCOUNTER — Ambulatory Visit (HOSPITAL_COMMUNITY): Payer: Managed Care, Other (non HMO)

## 2022-06-18 ENCOUNTER — Ambulatory Visit (HOSPITAL_COMMUNITY): Payer: Managed Care, Other (non HMO)

## 2022-06-21 ENCOUNTER — Other Ambulatory Visit (HOSPITAL_COMMUNITY): Payer: Self-pay

## 2022-06-21 ENCOUNTER — Ambulatory Visit (HOSPITAL_COMMUNITY): Payer: Managed Care, Other (non HMO)

## 2022-06-21 ENCOUNTER — Other Ambulatory Visit (HOSPITAL_BASED_OUTPATIENT_CLINIC_OR_DEPARTMENT_OTHER): Payer: Self-pay | Admitting: Cardiology

## 2022-06-23 ENCOUNTER — Other Ambulatory Visit: Payer: Self-pay

## 2022-06-23 ENCOUNTER — Ambulatory Visit (HOSPITAL_COMMUNITY): Payer: Managed Care, Other (non HMO)

## 2022-06-23 ENCOUNTER — Other Ambulatory Visit (HOSPITAL_COMMUNITY): Payer: Self-pay

## 2022-06-23 MED ORDER — DAPAGLIFLOZIN PROPANEDIOL 10 MG PO TABS
10.0000 mg | ORAL_TABLET | Freq: Every day | ORAL | 3 refills | Status: DC
  Filled 2022-06-23 – 2022-07-15 (×2): qty 30, 30d supply, fill #0
  Filled 2022-08-13: qty 30, 30d supply, fill #1
  Filled 2022-09-06 (×2): qty 30, 30d supply, fill #2
  Filled 2022-10-06: qty 30, 30d supply, fill #3

## 2022-06-23 MED ORDER — FENOFIBRATE 160 MG PO TABS
160.0000 mg | ORAL_TABLET | Freq: Every day | ORAL | 2 refills | Status: DC
  Filled 2022-06-23: qty 30, 30d supply, fill #0
  Filled 2022-07-05 – 2022-07-15 (×2): qty 30, 30d supply, fill #1
  Filled 2022-08-17: qty 30, 30d supply, fill #2

## 2022-06-23 MED ORDER — ATORVASTATIN CALCIUM 80 MG PO TABS
80.0000 mg | ORAL_TABLET | Freq: Every day | ORAL | 3 refills | Status: DC
  Filled 2022-06-23 – 2022-07-15 (×2): qty 30, 30d supply, fill #0
  Filled 2022-08-13: qty 30, 30d supply, fill #1
  Filled 2022-09-06 (×2): qty 30, 30d supply, fill #2
  Filled 2022-10-06: qty 30, 30d supply, fill #3

## 2022-06-25 ENCOUNTER — Ambulatory Visit (HOSPITAL_COMMUNITY): Payer: Managed Care, Other (non HMO)

## 2022-06-28 ENCOUNTER — Ambulatory Visit (HOSPITAL_COMMUNITY): Payer: Managed Care, Other (non HMO)

## 2022-06-30 ENCOUNTER — Ambulatory Visit (HOSPITAL_COMMUNITY): Payer: Managed Care, Other (non HMO)

## 2022-07-02 ENCOUNTER — Ambulatory Visit (HOSPITAL_COMMUNITY): Payer: Managed Care, Other (non HMO)

## 2022-07-05 ENCOUNTER — Other Ambulatory Visit (HOSPITAL_COMMUNITY): Payer: Self-pay

## 2022-07-05 ENCOUNTER — Ambulatory Visit (HOSPITAL_COMMUNITY): Payer: Managed Care, Other (non HMO)

## 2022-07-07 ENCOUNTER — Encounter (HOSPITAL_BASED_OUTPATIENT_CLINIC_OR_DEPARTMENT_OTHER): Payer: Self-pay | Admitting: Cardiology

## 2022-07-07 ENCOUNTER — Ambulatory Visit (HOSPITAL_COMMUNITY): Payer: Managed Care, Other (non HMO)

## 2022-07-09 ENCOUNTER — Ambulatory Visit (HOSPITAL_COMMUNITY): Payer: Managed Care, Other (non HMO)

## 2022-07-12 ENCOUNTER — Other Ambulatory Visit (HOSPITAL_COMMUNITY): Payer: Self-pay

## 2022-07-12 ENCOUNTER — Ambulatory Visit (HOSPITAL_COMMUNITY): Payer: Managed Care, Other (non HMO)

## 2022-07-14 ENCOUNTER — Ambulatory Visit (HOSPITAL_COMMUNITY): Payer: Managed Care, Other (non HMO)

## 2022-07-15 ENCOUNTER — Other Ambulatory Visit: Payer: Self-pay

## 2022-07-16 ENCOUNTER — Other Ambulatory Visit (HOSPITAL_COMMUNITY): Payer: Self-pay

## 2022-07-16 ENCOUNTER — Other Ambulatory Visit: Payer: Self-pay

## 2022-07-16 ENCOUNTER — Ambulatory Visit (HOSPITAL_COMMUNITY): Payer: Managed Care, Other (non HMO)

## 2022-07-17 ENCOUNTER — Other Ambulatory Visit (HOSPITAL_COMMUNITY): Payer: Self-pay

## 2022-07-19 ENCOUNTER — Other Ambulatory Visit: Payer: Self-pay

## 2022-07-21 ENCOUNTER — Other Ambulatory Visit (HOSPITAL_COMMUNITY): Payer: Self-pay

## 2022-07-21 ENCOUNTER — Other Ambulatory Visit: Payer: Self-pay

## 2022-07-22 ENCOUNTER — Other Ambulatory Visit (HOSPITAL_COMMUNITY): Payer: Self-pay

## 2022-07-22 ENCOUNTER — Other Ambulatory Visit: Payer: Self-pay

## 2022-07-22 MED ORDER — EASY TOUCH INSULIN BARRELS 1ML MISC
0 refills | Status: AC
  Filled 2022-07-22: qty 200, 100d supply, fill #0

## 2022-07-23 ENCOUNTER — Other Ambulatory Visit (HOSPITAL_BASED_OUTPATIENT_CLINIC_OR_DEPARTMENT_OTHER): Payer: Self-pay

## 2022-07-23 ENCOUNTER — Other Ambulatory Visit: Payer: Self-pay

## 2022-08-02 ENCOUNTER — Other Ambulatory Visit (HOSPITAL_COMMUNITY): Payer: Self-pay

## 2022-08-07 ENCOUNTER — Other Ambulatory Visit (HOSPITAL_COMMUNITY): Payer: Self-pay

## 2022-08-13 ENCOUNTER — Other Ambulatory Visit: Payer: Self-pay

## 2022-08-17 ENCOUNTER — Other Ambulatory Visit: Payer: Self-pay

## 2022-08-23 ENCOUNTER — Other Ambulatory Visit: Payer: Self-pay

## 2022-09-03 ENCOUNTER — Other Ambulatory Visit: Payer: Self-pay

## 2022-09-06 ENCOUNTER — Other Ambulatory Visit: Payer: Self-pay

## 2022-09-06 ENCOUNTER — Other Ambulatory Visit (HOSPITAL_COMMUNITY): Payer: Self-pay

## 2022-09-14 ENCOUNTER — Other Ambulatory Visit: Payer: Self-pay

## 2022-09-14 ENCOUNTER — Other Ambulatory Visit (HOSPITAL_BASED_OUTPATIENT_CLINIC_OR_DEPARTMENT_OTHER): Payer: Self-pay | Admitting: Cardiology

## 2022-09-14 ENCOUNTER — Other Ambulatory Visit (HOSPITAL_COMMUNITY): Payer: Self-pay

## 2022-09-14 MED ORDER — FENOFIBRATE 160 MG PO TABS
160.0000 mg | ORAL_TABLET | Freq: Every day | ORAL | 1 refills | Status: DC
  Filled 2022-09-14: qty 90, 90d supply, fill #0
  Filled 2022-12-15: qty 90, 90d supply, fill #1

## 2022-09-14 NOTE — Telephone Encounter (Signed)
Rx request sent to pharmacy.  

## 2022-09-18 ENCOUNTER — Other Ambulatory Visit (HOSPITAL_COMMUNITY): Payer: Self-pay

## 2022-09-20 ENCOUNTER — Other Ambulatory Visit: Payer: Self-pay

## 2022-09-21 ENCOUNTER — Other Ambulatory Visit: Payer: Self-pay

## 2022-09-21 ENCOUNTER — Other Ambulatory Visit (HOSPITAL_COMMUNITY): Payer: Self-pay

## 2022-09-21 MED ORDER — METOPROLOL SUCCINATE ER 50 MG PO TB24
50.0000 mg | ORAL_TABLET | Freq: Every day | ORAL | 3 refills | Status: DC
  Filled 2022-09-21: qty 90, 90d supply, fill #0
  Filled 2022-12-15: qty 90, 90d supply, fill #1
  Filled 2023-03-25: qty 90, 90d supply, fill #2
  Filled 2023-05-28 – 2023-07-03 (×2): qty 90, 90d supply, fill #3

## 2022-10-04 ENCOUNTER — Other Ambulatory Visit (HOSPITAL_COMMUNITY): Payer: Self-pay

## 2022-10-07 ENCOUNTER — Other Ambulatory Visit (HOSPITAL_COMMUNITY): Payer: Self-pay

## 2022-10-18 ENCOUNTER — Other Ambulatory Visit: Payer: Self-pay

## 2022-11-03 ENCOUNTER — Other Ambulatory Visit: Payer: Self-pay

## 2022-11-05 ENCOUNTER — Other Ambulatory Visit (HOSPITAL_BASED_OUTPATIENT_CLINIC_OR_DEPARTMENT_OTHER): Payer: Self-pay | Admitting: Cardiology

## 2022-11-05 ENCOUNTER — Other Ambulatory Visit (HOSPITAL_COMMUNITY): Payer: Self-pay

## 2022-11-05 ENCOUNTER — Other Ambulatory Visit: Payer: Self-pay

## 2022-11-05 MED ORDER — ATORVASTATIN CALCIUM 80 MG PO TABS
80.0000 mg | ORAL_TABLET | Freq: Every day | ORAL | 1 refills | Status: DC
  Filled 2022-11-05: qty 90, 90d supply, fill #0
  Filled 2023-01-30: qty 90, 90d supply, fill #1

## 2022-11-05 NOTE — Telephone Encounter (Signed)
Rx request sent to pharmacy.  

## 2022-11-14 ENCOUNTER — Other Ambulatory Visit (HOSPITAL_BASED_OUTPATIENT_CLINIC_OR_DEPARTMENT_OTHER): Payer: Self-pay | Admitting: Cardiology

## 2022-11-15 ENCOUNTER — Other Ambulatory Visit: Payer: Self-pay

## 2022-11-15 ENCOUNTER — Other Ambulatory Visit (HOSPITAL_COMMUNITY): Payer: Self-pay

## 2022-11-15 MED ORDER — DAPAGLIFLOZIN PROPANEDIOL 10 MG PO TABS
10.0000 mg | ORAL_TABLET | Freq: Every day | ORAL | 6 refills | Status: AC
  Filled 2022-11-15: qty 30, 30d supply, fill #0
  Filled 2022-12-30: qty 30, 30d supply, fill #1
  Filled 2023-01-30: qty 30, 30d supply, fill #2
  Filled 2023-02-26: qty 30, 30d supply, fill #3
  Filled 2023-04-17: qty 30, 30d supply, fill #4
  Filled 2023-05-28: qty 30, 30d supply, fill #5
  Filled 2023-07-03: qty 30, 30d supply, fill #6

## 2022-11-15 NOTE — Telephone Encounter (Signed)
Rx request sent to pharmacy.  

## 2022-12-07 ENCOUNTER — Other Ambulatory Visit: Payer: Self-pay

## 2022-12-07 ENCOUNTER — Encounter: Payer: Self-pay | Admitting: Pharmacist

## 2022-12-07 ENCOUNTER — Other Ambulatory Visit (HOSPITAL_COMMUNITY): Payer: Self-pay

## 2022-12-07 MED ORDER — AZITHROMYCIN 250 MG PO TABS
ORAL_TABLET | ORAL | 0 refills | Status: AC
  Filled 2022-12-07: qty 6, 5d supply, fill #0

## 2022-12-07 MED ORDER — FUROSEMIDE 20 MG PO TABS
20.0000 mg | ORAL_TABLET | Freq: Every day | ORAL | 0 refills | Status: DC
  Filled 2022-12-07: qty 30, 30d supply, fill #0

## 2022-12-07 MED ORDER — GABAPENTIN 100 MG PO CAPS
100.0000 mg | ORAL_CAPSULE | Freq: Every evening | ORAL | 5 refills | Status: AC
  Filled 2022-12-07: qty 60, 30d supply, fill #0
  Filled 2023-01-30: qty 60, 30d supply, fill #1
  Filled 2023-02-26: qty 60, 30d supply, fill #2
  Filled 2023-04-17: qty 60, 30d supply, fill #3
  Filled 2023-05-28: qty 60, 30d supply, fill #4
  Filled 2023-07-03: qty 60, 30d supply, fill #5

## 2022-12-07 MED ORDER — BUPROPION HCL ER (XL) 150 MG PO TB24
300.0000 mg | ORAL_TABLET | Freq: Every morning | ORAL | 0 refills | Status: DC
  Filled 2022-12-07: qty 60, 30d supply, fill #0

## 2022-12-07 MED ORDER — OZEMPIC (0.25 OR 0.5 MG/DOSE) 2 MG/3ML ~~LOC~~ SOPN
PEN_INJECTOR | SUBCUTANEOUS | 1 refills | Status: AC
  Filled 2022-12-07: qty 3, 42d supply, fill #0
  Filled 2023-02-26: qty 3, 28d supply, fill #1

## 2022-12-16 ENCOUNTER — Other Ambulatory Visit (HOSPITAL_COMMUNITY): Payer: Self-pay

## 2022-12-30 ENCOUNTER — Other Ambulatory Visit: Payer: Self-pay

## 2022-12-30 ENCOUNTER — Other Ambulatory Visit (HOSPITAL_COMMUNITY): Payer: Self-pay

## 2022-12-30 MED ORDER — BUPROPION HCL ER (XL) 150 MG PO TB24
300.0000 mg | ORAL_TABLET | Freq: Every morning | ORAL | 1 refills | Status: DC
  Filled 2022-12-30: qty 60, 30d supply, fill #0
  Filled 2023-01-30: qty 180, 90d supply, fill #0

## 2023-01-30 ENCOUNTER — Other Ambulatory Visit (HOSPITAL_BASED_OUTPATIENT_CLINIC_OR_DEPARTMENT_OTHER): Payer: Self-pay | Admitting: Cardiology

## 2023-01-30 ENCOUNTER — Other Ambulatory Visit (HOSPITAL_COMMUNITY): Payer: Self-pay

## 2023-01-30 DIAGNOSIS — I5022 Chronic systolic (congestive) heart failure: Secondary | ICD-10-CM

## 2023-01-31 ENCOUNTER — Other Ambulatory Visit (HOSPITAL_COMMUNITY): Payer: Self-pay

## 2023-01-31 ENCOUNTER — Other Ambulatory Visit: Payer: Self-pay

## 2023-01-31 MED ORDER — ENTRESTO 24-26 MG PO TABS
1.0000 | ORAL_TABLET | Freq: Two times a day (BID) | ORAL | 0 refills | Status: DC
  Filled 2023-01-31: qty 180, 90d supply, fill #0

## 2023-01-31 NOTE — Telephone Encounter (Signed)
Rx request sent to pharmacy.  

## 2023-02-01 ENCOUNTER — Other Ambulatory Visit (HOSPITAL_COMMUNITY): Payer: Self-pay

## 2023-02-01 ENCOUNTER — Other Ambulatory Visit: Payer: Self-pay

## 2023-02-16 ENCOUNTER — Other Ambulatory Visit (HOSPITAL_COMMUNITY): Payer: Self-pay

## 2023-02-26 ENCOUNTER — Other Ambulatory Visit (HOSPITAL_COMMUNITY): Payer: Self-pay

## 2023-02-28 ENCOUNTER — Other Ambulatory Visit: Payer: Self-pay

## 2023-03-03 ENCOUNTER — Telehealth: Payer: Self-pay | Admitting: Pharmacy Technician

## 2023-03-03 ENCOUNTER — Other Ambulatory Visit (HOSPITAL_COMMUNITY): Payer: Self-pay

## 2023-03-03 NOTE — Telephone Encounter (Signed)
Pharmacy Patient Advocate Encounter   Received notification from CoverMyMeds that prior authorization for farxiga is required/requested.   Insurance verification completed.   The patient is insured through [employer] .   Per test claim: Refill too soon. PA is not needed at this time. Medication was filled 02/17/23. Next eligible fill date is 03/20/23. Drug is on formulary and no PA needed.

## 2023-03-07 ENCOUNTER — Other Ambulatory Visit (HOSPITAL_COMMUNITY): Payer: Self-pay

## 2023-03-25 ENCOUNTER — Other Ambulatory Visit: Payer: Self-pay | Admitting: Cardiology

## 2023-03-25 ENCOUNTER — Other Ambulatory Visit (HOSPITAL_COMMUNITY): Payer: Self-pay

## 2023-03-25 ENCOUNTER — Other Ambulatory Visit (HOSPITAL_BASED_OUTPATIENT_CLINIC_OR_DEPARTMENT_OTHER): Payer: Self-pay | Admitting: Cardiology

## 2023-03-25 ENCOUNTER — Other Ambulatory Visit: Payer: Self-pay

## 2023-03-25 LAB — LAB REPORT - SCANNED
A1c: 14
EGFR: 46

## 2023-03-25 MED ORDER — BUPROPION HCL ER (XL) 150 MG PO TB24
300.0000 mg | ORAL_TABLET | Freq: Every morning | ORAL | 1 refills | Status: AC
  Filled 2023-03-25: qty 180, 90d supply, fill #0

## 2023-03-25 MED ORDER — ATORVASTATIN CALCIUM 80 MG PO TABS
80.0000 mg | ORAL_TABLET | Freq: Every day | ORAL | 0 refills | Status: DC
  Filled 2023-03-25 – 2023-04-30 (×2): qty 30, 30d supply, fill #0

## 2023-03-25 MED ORDER — OZEMPIC (1 MG/DOSE) 4 MG/3ML ~~LOC~~ SOPN
1.0000 mg | PEN_INJECTOR | SUBCUTANEOUS | 3 refills | Status: AC
  Filled 2023-03-25: qty 9, 84d supply, fill #0
  Filled 2023-03-25: qty 3, 28d supply, fill #0
  Filled 2023-07-03: qty 9, 84d supply, fill #1
  Filled 2023-09-29 – 2023-10-06 (×2): qty 9, 84d supply, fill #2
  Filled 2024-01-15: qty 9, 84d supply, fill #3

## 2023-03-25 MED ORDER — BUPROPION HCL ER (XL) 300 MG PO TB24
300.0000 mg | ORAL_TABLET | Freq: Every morning | ORAL | 3 refills | Status: AC
  Filled 2023-03-25 (×2): qty 90, 90d supply, fill #0
  Filled 2023-05-28 – 2023-07-03 (×2): qty 90, 90d supply, fill #1
  Filled 2023-09-29: qty 90, 90d supply, fill #2
  Filled 2024-01-15: qty 90, 90d supply, fill #3

## 2023-03-25 MED ORDER — FENOFIBRATE 160 MG PO TABS
160.0000 mg | ORAL_TABLET | Freq: Every day | ORAL | 0 refills | Status: DC
  Filled 2023-03-25: qty 30, 30d supply, fill #0

## 2023-03-25 MED ORDER — ASPIRIN 81 MG PO TBEC
81.0000 mg | DELAYED_RELEASE_TABLET | Freq: Every day | ORAL | 0 refills | Status: AC
  Filled 2023-03-25: qty 30, 30d supply, fill #0

## 2023-04-17 ENCOUNTER — Other Ambulatory Visit (HOSPITAL_BASED_OUTPATIENT_CLINIC_OR_DEPARTMENT_OTHER): Payer: Self-pay | Admitting: Cardiology

## 2023-04-18 ENCOUNTER — Other Ambulatory Visit (HOSPITAL_COMMUNITY): Payer: Self-pay

## 2023-04-18 ENCOUNTER — Other Ambulatory Visit: Payer: Self-pay

## 2023-04-18 MED ORDER — FENOFIBRATE 160 MG PO TABS
160.0000 mg | ORAL_TABLET | Freq: Every day | ORAL | 0 refills | Status: DC
  Filled 2023-04-18: qty 15, 15d supply, fill #0

## 2023-04-26 ENCOUNTER — Other Ambulatory Visit (HOSPITAL_COMMUNITY): Payer: Self-pay

## 2023-04-30 ENCOUNTER — Other Ambulatory Visit: Payer: Self-pay | Admitting: Internal Medicine

## 2023-04-30 ENCOUNTER — Other Ambulatory Visit (HOSPITAL_COMMUNITY): Payer: Self-pay

## 2023-05-01 ENCOUNTER — Other Ambulatory Visit: Payer: Self-pay

## 2023-05-02 ENCOUNTER — Other Ambulatory Visit: Payer: Self-pay

## 2023-05-28 ENCOUNTER — Other Ambulatory Visit (HOSPITAL_BASED_OUTPATIENT_CLINIC_OR_DEPARTMENT_OTHER): Payer: Self-pay | Admitting: Cardiology

## 2023-05-28 ENCOUNTER — Other Ambulatory Visit (HOSPITAL_COMMUNITY): Payer: Self-pay

## 2023-05-28 DIAGNOSIS — I5022 Chronic systolic (congestive) heart failure: Secondary | ICD-10-CM

## 2023-05-30 ENCOUNTER — Other Ambulatory Visit (HOSPITAL_COMMUNITY): Payer: Self-pay

## 2023-05-30 MED ORDER — FENOFIBRATE 160 MG PO TABS
160.0000 mg | ORAL_TABLET | Freq: Every day | ORAL | 0 refills | Status: DC
  Filled 2023-05-30: qty 30, 30d supply, fill #0

## 2023-05-30 MED ORDER — ENTRESTO 24-26 MG PO TABS
1.0000 | ORAL_TABLET | Freq: Two times a day (BID) | ORAL | 0 refills | Status: DC
  Filled 2023-05-30: qty 60, 30d supply, fill #0

## 2023-05-30 MED ORDER — ATORVASTATIN CALCIUM 80 MG PO TABS
80.0000 mg | ORAL_TABLET | Freq: Every day | ORAL | 0 refills | Status: DC
  Filled 2023-05-30: qty 30, 30d supply, fill #0

## 2023-05-31 ENCOUNTER — Other Ambulatory Visit: Payer: Self-pay

## 2023-07-03 ENCOUNTER — Other Ambulatory Visit (HOSPITAL_COMMUNITY): Payer: Self-pay

## 2023-07-03 ENCOUNTER — Other Ambulatory Visit (HOSPITAL_BASED_OUTPATIENT_CLINIC_OR_DEPARTMENT_OTHER): Payer: Self-pay | Admitting: Cardiology

## 2023-07-03 DIAGNOSIS — I5022 Chronic systolic (congestive) heart failure: Secondary | ICD-10-CM

## 2023-07-04 ENCOUNTER — Other Ambulatory Visit (HOSPITAL_COMMUNITY): Payer: Self-pay

## 2023-07-04 ENCOUNTER — Other Ambulatory Visit: Payer: Self-pay

## 2023-07-04 MED ORDER — ENTRESTO 24-26 MG PO TABS
1.0000 | ORAL_TABLET | Freq: Two times a day (BID) | ORAL | 0 refills | Status: AC
  Filled 2023-07-04: qty 30, 15d supply, fill #0

## 2023-07-04 MED ORDER — ATORVASTATIN CALCIUM 80 MG PO TABS
80.0000 mg | ORAL_TABLET | Freq: Every day | ORAL | 0 refills | Status: DC
  Filled 2023-07-04: qty 15, 15d supply, fill #0

## 2023-07-04 MED ORDER — FENOFIBRATE 160 MG PO TABS
160.0000 mg | ORAL_TABLET | Freq: Every day | ORAL | 0 refills | Status: DC
  Filled 2023-07-04: qty 15, 15d supply, fill #0

## 2023-07-13 ENCOUNTER — Other Ambulatory Visit (HOSPITAL_BASED_OUTPATIENT_CLINIC_OR_DEPARTMENT_OTHER): Payer: Self-pay | Admitting: Cardiology

## 2023-07-14 ENCOUNTER — Other Ambulatory Visit (HOSPITAL_COMMUNITY): Payer: Self-pay

## 2023-07-14 ENCOUNTER — Other Ambulatory Visit: Payer: Self-pay

## 2023-07-14 MED ORDER — FENOFIBRATE 160 MG PO TABS
160.0000 mg | ORAL_TABLET | Freq: Every day | ORAL | 0 refills | Status: AC
  Filled 2023-07-14 – 2023-07-15 (×2): qty 15, 15d supply, fill #0

## 2023-07-14 MED ORDER — ATORVASTATIN CALCIUM 80 MG PO TABS: 80.0000 mg | ORAL_TABLET | Freq: Every day | ORAL | 0 refills | Status: AC

## 2023-07-15 ENCOUNTER — Other Ambulatory Visit (HOSPITAL_COMMUNITY): Payer: Self-pay

## 2023-07-15 ENCOUNTER — Other Ambulatory Visit: Payer: Self-pay

## 2023-07-28 ENCOUNTER — Other Ambulatory Visit (HOSPITAL_COMMUNITY): Payer: Self-pay

## 2023-09-08 ENCOUNTER — Other Ambulatory Visit (HOSPITAL_BASED_OUTPATIENT_CLINIC_OR_DEPARTMENT_OTHER): Payer: Self-pay | Admitting: Cardiology

## 2023-09-08 ENCOUNTER — Other Ambulatory Visit (HOSPITAL_COMMUNITY): Payer: Self-pay

## 2023-09-08 DIAGNOSIS — I5022 Chronic systolic (congestive) heart failure: Secondary | ICD-10-CM

## 2023-09-10 ENCOUNTER — Other Ambulatory Visit (HOSPITAL_COMMUNITY): Payer: Self-pay

## 2023-09-29 ENCOUNTER — Other Ambulatory Visit (HOSPITAL_COMMUNITY): Payer: Self-pay

## 2023-09-29 ENCOUNTER — Other Ambulatory Visit (HOSPITAL_BASED_OUTPATIENT_CLINIC_OR_DEPARTMENT_OTHER): Payer: Self-pay | Admitting: Cardiology

## 2023-09-29 DIAGNOSIS — I5022 Chronic systolic (congestive) heart failure: Secondary | ICD-10-CM

## 2023-09-30 ENCOUNTER — Other Ambulatory Visit (HOSPITAL_COMMUNITY): Payer: Self-pay

## 2023-09-30 NOTE — Telephone Encounter (Signed)
 Dr. Idolina Maker pt. She is passed her 3rd attempt. Does Dr. Veryl Gottron want to refill? Please advise.

## 2023-10-03 ENCOUNTER — Other Ambulatory Visit: Payer: Self-pay

## 2023-10-03 ENCOUNTER — Other Ambulatory Visit (HOSPITAL_COMMUNITY): Payer: Self-pay

## 2023-10-03 MED ORDER — METOPROLOL SUCCINATE ER 50 MG PO TB24
50.0000 mg | ORAL_TABLET | Freq: Every day | ORAL | 0 refills | Status: AC
  Filled 2023-10-03: qty 90, 90d supply, fill #0

## 2023-10-03 MED ORDER — GABAPENTIN 100 MG PO CAPS
100.0000 mg | ORAL_CAPSULE | Freq: Every day | ORAL | 0 refills | Status: AC
  Filled 2023-10-03: qty 60, 30d supply, fill #0

## 2023-10-06 ENCOUNTER — Other Ambulatory Visit: Payer: Self-pay

## 2023-10-06 ENCOUNTER — Other Ambulatory Visit (HOSPITAL_COMMUNITY): Payer: Self-pay

## 2023-10-17 ENCOUNTER — Other Ambulatory Visit (HOSPITAL_COMMUNITY): Payer: Self-pay

## 2023-11-13 ENCOUNTER — Other Ambulatory Visit (HOSPITAL_BASED_OUTPATIENT_CLINIC_OR_DEPARTMENT_OTHER): Payer: Self-pay | Admitting: Cardiology

## 2023-11-24 ENCOUNTER — Other Ambulatory Visit (HOSPITAL_COMMUNITY): Payer: Self-pay

## 2024-01-15 ENCOUNTER — Other Ambulatory Visit (HOSPITAL_COMMUNITY): Payer: Self-pay

## 2024-01-16 ENCOUNTER — Other Ambulatory Visit (HOSPITAL_COMMUNITY): Payer: Self-pay

## 2024-01-17 ENCOUNTER — Other Ambulatory Visit: Payer: Self-pay

## 2024-01-17 ENCOUNTER — Other Ambulatory Visit (HOSPITAL_COMMUNITY): Payer: Self-pay

## 2024-01-26 ENCOUNTER — Other Ambulatory Visit (HOSPITAL_COMMUNITY): Payer: Self-pay

## 2024-01-27 ENCOUNTER — Other Ambulatory Visit (HOSPITAL_COMMUNITY): Payer: Self-pay

## 2024-01-27 MED ORDER — GABAPENTIN 100 MG PO CAPS
100.0000 mg | ORAL_CAPSULE | Freq: Every day | ORAL | 0 refills | Status: AC
  Filled 2024-01-27: qty 60, 30d supply, fill #0

## 2024-01-30 ENCOUNTER — Other Ambulatory Visit: Payer: Self-pay

## 2024-02-01 ENCOUNTER — Other Ambulatory Visit (HOSPITAL_COMMUNITY): Payer: Self-pay

## 2024-05-07 ENCOUNTER — Other Ambulatory Visit (HOSPITAL_COMMUNITY): Payer: Self-pay

## 2024-05-07 ENCOUNTER — Other Ambulatory Visit: Payer: Self-pay

## 2024-05-07 MED ORDER — SACUBITRIL-VALSARTAN 24-26 MG PO TABS
1.0000 | ORAL_TABLET | Freq: Every day | ORAL | 3 refills | Status: AC
  Filled 2024-05-07: qty 30, 30d supply, fill #0

## 2024-05-07 MED ORDER — DAPAGLIFLOZIN PROPANEDIOL 10 MG PO TABS
10.0000 mg | ORAL_TABLET | Freq: Every day | ORAL | 3 refills | Status: DC
  Filled 2024-05-07: qty 30, 30d supply, fill #0

## 2024-05-07 MED ORDER — ATORVASTATIN CALCIUM 80 MG PO TABS
80.0000 mg | ORAL_TABLET | Freq: Every day | ORAL | 3 refills | Status: AC
  Filled 2024-05-07: qty 30, 30d supply, fill #0

## 2024-05-07 MED ORDER — GABAPENTIN 100 MG PO CAPS
100.0000 mg | ORAL_CAPSULE | Freq: Every evening | ORAL | 0 refills | Status: AC
  Filled 2024-05-07: qty 60, 30d supply, fill #0

## 2024-05-07 MED ORDER — METOPROLOL SUCCINATE ER 50 MG PO TB24
50.0000 mg | ORAL_TABLET | Freq: Every day | ORAL | 0 refills | Status: AC
  Filled 2024-05-07: qty 90, 90d supply, fill #0

## 2024-05-07 MED ORDER — FENOFIBRATE 160 MG PO TABS
160.0000 mg | ORAL_TABLET | Freq: Every day | ORAL | 3 refills | Status: AC
  Filled 2024-05-07: qty 30, 30d supply, fill #0

## 2024-05-08 ENCOUNTER — Other Ambulatory Visit (HOSPITAL_COMMUNITY): Payer: Self-pay

## 2024-05-10 ENCOUNTER — Other Ambulatory Visit (HOSPITAL_COMMUNITY): Payer: Self-pay

## 2024-05-10 MED ORDER — FARXIGA 10 MG PO TABS
10.0000 mg | ORAL_TABLET | Freq: Every day | ORAL | 3 refills | Status: AC
  Filled 2024-05-10: qty 30, 30d supply, fill #0

## 2024-05-24 ENCOUNTER — Other Ambulatory Visit (HOSPITAL_COMMUNITY): Payer: Self-pay

## 2024-05-30 ENCOUNTER — Other Ambulatory Visit (HOSPITAL_COMMUNITY): Payer: Self-pay

## 2024-06-10 ENCOUNTER — Other Ambulatory Visit (HOSPITAL_COMMUNITY): Payer: Self-pay

## 2024-06-12 ENCOUNTER — Other Ambulatory Visit (HOSPITAL_COMMUNITY): Payer: Self-pay
